# Patient Record
Sex: Female | Born: 1975 | Race: Black or African American | Hispanic: No | Marital: Married | Smoking: Never smoker
Health system: Southern US, Community
[De-identification: ages and names within clinical notes are randomized; demographics above are authoritative.]

## PROBLEM LIST (undated history)

## (undated) ENCOUNTER — Inpatient Hospital Stay (HOSPITAL_COMMUNITY): Payer: Self-pay

## (undated) DIAGNOSIS — J302 Other seasonal allergic rhinitis: Secondary | ICD-10-CM

## (undated) DIAGNOSIS — B999 Unspecified infectious disease: Secondary | ICD-10-CM

## (undated) DIAGNOSIS — R87619 Unspecified abnormal cytological findings in specimens from cervix uteri: Secondary | ICD-10-CM

## (undated) DIAGNOSIS — IMO0002 Reserved for concepts with insufficient information to code with codable children: Secondary | ICD-10-CM

## (undated) DIAGNOSIS — O139 Gestational [pregnancy-induced] hypertension without significant proteinuria, unspecified trimester: Secondary | ICD-10-CM

## (undated) DIAGNOSIS — F419 Anxiety disorder, unspecified: Secondary | ICD-10-CM

## (undated) DIAGNOSIS — J189 Pneumonia, unspecified organism: Secondary | ICD-10-CM

## (undated) HISTORY — DX: Reserved for concepts with insufficient information to code with codable children: IMO0002

## (undated) HISTORY — DX: Unspecified abnormal cytological findings in specimens from cervix uteri: R87.619

## (undated) HISTORY — DX: Gestational (pregnancy-induced) hypertension without significant proteinuria, unspecified trimester: O13.9

## (undated) HISTORY — DX: Other seasonal allergic rhinitis: J30.2

## (undated) HISTORY — DX: Unspecified infectious disease: B99.9

## (undated) HISTORY — DX: Pneumonia, unspecified organism: J18.9

## (undated) HISTORY — DX: Anxiety disorder, unspecified: F41.9

---

## 2006-11-30 DIAGNOSIS — J189 Pneumonia, unspecified organism: Secondary | ICD-10-CM

## 2006-11-30 HISTORY — DX: Pneumonia, unspecified organism: J18.9

## 2009-04-04 ENCOUNTER — Emergency Department (HOSPITAL_COMMUNITY): Admission: EM | Admit: 2009-04-04 | Discharge: 2009-04-04 | Payer: Self-pay | Admitting: Emergency Medicine

## 2009-11-20 ENCOUNTER — Ambulatory Visit (HOSPITAL_COMMUNITY): Admission: RE | Admit: 2009-11-20 | Discharge: 2009-11-20 | Payer: Self-pay | Admitting: Obstetrics and Gynecology

## 2010-09-12 ENCOUNTER — Inpatient Hospital Stay (HOSPITAL_COMMUNITY): Admission: AD | Admit: 2010-09-12 | Discharge: 2010-09-15 | Payer: Self-pay | Admitting: Obstetrics and Gynecology

## 2010-09-22 ENCOUNTER — Inpatient Hospital Stay (HOSPITAL_COMMUNITY): Admission: AD | Admit: 2010-09-22 | Discharge: 2010-09-22 | Payer: Self-pay | Admitting: Obstetrics and Gynecology

## 2011-02-11 LAB — URINALYSIS, ROUTINE W REFLEX MICROSCOPIC
Glucose, UA: NEGATIVE mg/dL
Protein, ur: NEGATIVE mg/dL
pH: 6 (ref 5.0–8.0)

## 2011-02-11 LAB — CBC
HCT: 29.4 % — ABNORMAL LOW (ref 36.0–46.0)
MCH: 28.3 pg (ref 26.0–34.0)
MCHC: 33.8 g/dL (ref 30.0–36.0)
MCHC: 33.9 g/dL (ref 30.0–36.0)
MCV: 83.1 fL (ref 78.0–100.0)
Platelets: 497 10*3/uL — ABNORMAL HIGH (ref 150–400)
RBC: 3.81 MIL/uL — ABNORMAL LOW (ref 3.87–5.11)
RDW: 13.3 % (ref 11.5–15.5)

## 2011-02-11 LAB — COMPREHENSIVE METABOLIC PANEL
ALT: 21 U/L (ref 0–35)
AST: 16 U/L (ref 0–37)
Calcium: 8.8 mg/dL (ref 8.4–10.5)
Sodium: 138 mEq/L (ref 135–145)
Total Protein: 6.8 g/dL (ref 6.0–8.3)

## 2011-02-11 LAB — CCBB MATERNAL DONOR DRAW

## 2011-02-12 LAB — CBC
HCT: 33.6 % — ABNORMAL LOW (ref 36.0–46.0)
Hemoglobin: 11.4 g/dL — ABNORMAL LOW (ref 12.0–15.0)
RDW: 13 % (ref 11.5–15.5)
WBC: 7.5 10*3/uL (ref 4.0–10.5)

## 2011-02-12 LAB — RPR: RPR Ser Ql: NONREACTIVE

## 2011-03-10 LAB — POCT PREGNANCY, URINE

## 2012-01-28 ENCOUNTER — Encounter: Payer: Self-pay | Admitting: Obstetrics and Gynecology

## 2012-02-08 ENCOUNTER — Encounter (INDEPENDENT_AMBULATORY_CARE_PROVIDER_SITE_OTHER): Payer: BC Managed Care – PPO | Admitting: Obstetrics and Gynecology

## 2012-02-08 DIAGNOSIS — Z30432 Encounter for removal of intrauterine contraceptive device: Secondary | ICD-10-CM

## 2012-02-08 DIAGNOSIS — Z3040 Encounter for surveillance of contraceptives, unspecified: Secondary | ICD-10-CM

## 2012-02-15 ENCOUNTER — Encounter (INDEPENDENT_AMBULATORY_CARE_PROVIDER_SITE_OTHER): Payer: BC Managed Care – PPO

## 2012-02-15 ENCOUNTER — Encounter (INDEPENDENT_AMBULATORY_CARE_PROVIDER_SITE_OTHER): Payer: BC Managed Care – PPO | Admitting: Obstetrics and Gynecology

## 2012-02-15 DIAGNOSIS — N949 Unspecified condition associated with female genital organs and menstrual cycle: Secondary | ICD-10-CM

## 2012-07-18 ENCOUNTER — Ambulatory Visit (INDEPENDENT_AMBULATORY_CARE_PROVIDER_SITE_OTHER): Payer: BC Managed Care – PPO | Admitting: Family Medicine

## 2012-07-18 VITALS — BP 95/56 | HR 50 | Temp 98.1°F | Resp 18 | Ht 62.5 in | Wt 178.0 lb

## 2012-07-18 DIAGNOSIS — R141 Gas pain: Secondary | ICD-10-CM

## 2012-07-18 DIAGNOSIS — R143 Flatulence: Secondary | ICD-10-CM

## 2012-07-18 DIAGNOSIS — K5900 Constipation, unspecified: Secondary | ICD-10-CM

## 2012-07-18 DIAGNOSIS — R142 Eructation: Secondary | ICD-10-CM

## 2012-07-18 DIAGNOSIS — K219 Gastro-esophageal reflux disease without esophagitis: Secondary | ICD-10-CM

## 2012-07-18 DIAGNOSIS — R140 Abdominal distension (gaseous): Secondary | ICD-10-CM

## 2012-07-18 LAB — POCT URINALYSIS DIPSTICK
Blood, UA: NEGATIVE
Ketones, UA: NEGATIVE
Protein, UA: NEGATIVE
Spec Grav, UA: 1.025
Urobilinogen, UA: 0.2
pH, UA: 6

## 2012-07-18 LAB — POCT CBC
HCT, POC: 42.5 % (ref 37.7–47.9)
MCH, POC: 28.4 pg (ref 27–31.2)
MCV: 93.1 fL (ref 80–97)
MID (cbc): 0.6 (ref 0–0.9)
Platelet Count, POC: 357 10*3/uL (ref 142–424)
RBC: 4.57 M/uL (ref 4.04–5.48)
WBC: 6.2 10*3/uL (ref 4.6–10.2)

## 2012-07-18 LAB — POCT UA - MICROSCOPIC ONLY
Bacteria, U Microscopic: NEGATIVE
Casts, Ur, LPF, POC: NEGATIVE
Crystals, Ur, HPF, POC: NEGATIVE
Epithelial cells, urine per micros: NEGATIVE
Mucus, UA: NEGATIVE

## 2012-07-18 MED ORDER — OMEPRAZOLE 40 MG PO CPDR: 40.0000 mg | DELAYED_RELEASE_CAPSULE | Freq: Every day | ORAL | Status: DC

## 2012-07-18 NOTE — Progress Notes (Signed)
Subjective: 36 year old lady who is here with complaint of having abdominal bloating and discomfort over the weekend especially. Her last period was around 4 weeks ago. She denies risk of pregnancy. She really did not have a lot of pain. It was more the bloating and discomfort. She has been burping a lot. She felt like her esophagus with bothering her some. She had pains up into her chest and head. Bowels have been sluggish.  Objective: Pleasant alert oriented lady in no acute distress. Throat has a little bit of a cystic appearance to the right tonsil. Neck supple without significant nodes. Chest clear. Heart regular without murmurs. Abdomen was soft without organomegaly masses or other abnormality. She is mildly tender across the epigastrium, more in the left upper quadrant.  Assessment: Abdominal bloating. Consider the possibility of gallbladder problems, GERD, and constipation.  Plan: Check blood work and a urinalysis and urine hcg  Results for orders placed in visit on 07/18/12  POCT CBC      Component Value Range   WBC 6.2  4.6 - 10.2 K/uL   Lymph, poc 2.7  0.6 - 3.4   POC LYMPH PERCENT 44.3  10 - 50 %L   MID (cbc) 0.6  0 - 0.9   POC MID % 9.7  0 - 12 %M   POC Granulocyte 2.9  2 - 6.9   Granulocyte percent 46.0  37 - 80 %G   RBC 4.57  4.04 - 5.48 M/uL   Hemoglobin 13.0  12.2 - 16.2 g/dL   HCT, POC 16.1  09.6 - 47.9 %   MCV 93.1  80 - 97 fL   MCH, POC 28.4  27 - 31.2 pg   MCHC 30.6 (*) 31.8 - 35.4 g/dL   RDW, POC 04.5     Platelet Count, POC 357  142 - 424 K/uL   MPV 8.8  0 - 99.8 fL  POCT UA - MICROSCOPIC ONLY      Component Value Range   WBC, Ur, HPF, POC neg     RBC, urine, microscopic neg     Bacteria, U Microscopic neg     Mucus, UA neg     Epithelial cells, urine per micros neg     Crystals, Ur, HPF, POC neg     Casts, Ur, LPF, POC neg     Yeast, UA neg    POCT URINALYSIS DIPSTICK      Component Value Range   Color, UA yellow     Clarity, UA clear     Glucose,  UA neg     Bilirubin, UA neg     Ketones, UA neg     Spec Grav, UA 1.025     Blood, UA neg     pH, UA 6.0     Protein, UA neg     Urobilinogen, UA 0.2     Nitrite, UA neg     Leukocytes, UA Negative    POCT URINE PREGNANCY      Component Value Range   Preg Test, Ur Negative     Treat for constipation and secondary reflux. If she is not doing better, and he is continuing to be bloated a lot, we will need to get a gallbladder ultrasound on her.  Assessment: Constipation GERD Bloating

## 2012-07-18 NOTE — Patient Instructions (Signed)
MiraLax  Lots of water  Regular exercise  Lots of fresh fruits and vegetables  Avoid excessive bananas

## 2012-07-19 LAB — COMPREHENSIVE METABOLIC PANEL
BUN: 17 mg/dL (ref 6–23)
CO2: 27 mEq/L (ref 19–32)
Calcium: 9.3 mg/dL (ref 8.4–10.5)
Chloride: 103 mEq/L (ref 96–112)
Creat: 0.76 mg/dL (ref 0.50–1.10)
Glucose, Bld: 91 mg/dL (ref 70–99)
Total Bilirubin: 0.3 mg/dL (ref 0.3–1.2)

## 2012-09-11 ENCOUNTER — Emergency Department (HOSPITAL_COMMUNITY): Payer: BC Managed Care – PPO

## 2012-09-11 ENCOUNTER — Encounter (HOSPITAL_COMMUNITY): Payer: Self-pay | Admitting: *Deleted

## 2012-09-11 ENCOUNTER — Emergency Department (HOSPITAL_COMMUNITY)
Admission: EM | Admit: 2012-09-11 | Discharge: 2012-09-11 | Disposition: A | Discharge status: Home / Self Care | Payer: BC Managed Care – PPO | Attending: Emergency Medicine | Admitting: Emergency Medicine

## 2012-09-11 DIAGNOSIS — R1032 Left lower quadrant pain: Secondary | ICD-10-CM | POA: Insufficient documentation

## 2012-09-11 DIAGNOSIS — K5900 Constipation, unspecified: Secondary | ICD-10-CM

## 2012-09-11 DIAGNOSIS — R109 Unspecified abdominal pain: Secondary | ICD-10-CM

## 2012-09-11 LAB — URINALYSIS, ROUTINE W REFLEX MICROSCOPIC
Glucose, UA: NEGATIVE mg/dL
Ketones, ur: NEGATIVE mg/dL
Leukocytes, UA: NEGATIVE
Nitrite: NEGATIVE
Protein, ur: NEGATIVE mg/dL
pH: 7 (ref 5.0–8.0)

## 2012-09-11 LAB — COMPREHENSIVE METABOLIC PANEL
ALT: 12 U/L (ref 0–35)
AST: 14 U/L (ref 0–37)
Albumin: 3.6 g/dL (ref 3.5–5.2)
Alkaline Phosphatase: 55 U/L (ref 39–117)
BUN: 11 mg/dL (ref 6–23)
Chloride: 103 mEq/L (ref 96–112)
Potassium: 3.6 mEq/L (ref 3.5–5.1)
Sodium: 137 mEq/L (ref 135–145)
Total Bilirubin: 0.2 mg/dL — ABNORMAL LOW (ref 0.3–1.2)
Total Protein: 7 g/dL (ref 6.0–8.3)

## 2012-09-11 LAB — CBC WITH DIFFERENTIAL/PLATELET
Basophils Relative: 0 % (ref 0–1)
Eosinophils Absolute: 0.2 10*3/uL (ref 0.0–0.7)
Hemoglobin: 12.8 g/dL (ref 12.0–15.0)
MCH: 30 pg (ref 26.0–34.0)
MCHC: 34.5 g/dL (ref 30.0–36.0)
Monocytes Relative: 7 % (ref 3–12)
Neutro Abs: 2.4 10*3/uL (ref 1.7–7.7)
Neutrophils Relative %: 68 % (ref 43–77)
Platelets: 283 10*3/uL (ref 150–400)
RBC: 4.27 MIL/uL (ref 3.87–5.11)

## 2012-09-11 LAB — PREGNANCY, URINE: Preg Test, Ur: NEGATIVE

## 2012-09-11 MED ORDER — POLYETHYLENE GLYCOL 3350 17 G PO PACK: 17.0000 g | PACK | Freq: Every day | ORAL | Status: DC

## 2012-09-11 MED ORDER — METRONIDAZOLE 500 MG PO TABS: 500.0000 mg | ORAL_TABLET | Freq: Two times a day (BID) | ORAL | Status: DC

## 2012-09-11 MED ORDER — CIPROFLOXACIN HCL 500 MG PO TABS: 500.0000 mg | ORAL_TABLET | Freq: Two times a day (BID) | ORAL | Status: DC

## 2012-09-11 NOTE — ED Provider Notes (Signed)
History     CSN: [id number]  Arrival date & time 09/11/12  0159   First MD Initiated Contact with Patient 09/11/12 0225      Chief Complaint  Patient presents with  . Abdominal Pain    (Consider location/radiation/quality/duration/timing/severity/associated sxs/prior treatment) Patient is a 36 y.o. female presenting with abdominal pain. The history is provided by the patient.  Abdominal Pain The primary symptoms of the illness include abdominal pain. The primary symptoms of the illness do not include fever, shortness of breath, nausea, vomiting, dysuria or vaginal discharge.  The patient states that she believes she is currently not pregnant. The patient has had a change in bowel habit. Symptoms associated with the illness do not include back pain. Associated symptoms comments: LLQ abdominal pain that started last week and resolved. It returned yesterday and has been constant since. No N, V or diarrhea. In fact, she reports moving her bowels less than her usual. No abdominal surgeries in the past. No fever. She denies vaginal discharge, dysuria or hematuria.Marland Kitchen    History reviewed. No pertinent past medical history.  History reviewed. No pertinent past surgical history.  History reviewed. No pertinent family history.  History  Substance Use Topics  . Smoking status: Never Smoker   . Smokeless tobacco: Never Used  . Alcohol Use: Yes     social    OB History    Grav Para Term Preterm Abortions TAB SAB Ect Mult Living                  Review of Systems  Constitutional: Negative for fever.  Respiratory: Negative for shortness of breath.   Cardiovascular: Negative for chest pain.  Gastrointestinal: Positive for abdominal pain. Negative for nausea and vomiting.  Genitourinary: Negative for dysuria, flank pain and vaginal discharge.  Musculoskeletal: Negative for back pain.    Allergies  Review of patient's allergies indicates no known allergies.  Home Medications  No  current outpatient prescriptions on file.  BP 117/68  Pulse 72  Temp 98.7 F (37.1 C) (Oral)  Resp 18  Wt 173 lb (78.472 kg)  SpO2 100%  LMP 08/13/2012  Physical Exam  Constitutional: She is oriented to person, place, and time. She appears well-developed and well-nourished. No distress.  HENT:  Mouth/Throat: Oropharynx is clear and moist.  Cardiovascular: Normal rate and regular rhythm.   No murmur heard. Pulmonary/Chest: Effort normal and breath sounds normal. She has no wheezes. She has no rales.  Abdominal: Soft. Bowel sounds are normal. She exhibits no mass. There is tenderness. There is no rebound and no guarding.       LLQ Tenderness that is mild.  Neurological: She is alert and oriented to person, place, and time.  Skin: Skin is warm and dry.  Psychiatric: She has a normal mood and affect.    ED Course  Procedures (including critical care time)  Labs Reviewed  CBC WITH DIFFERENTIAL - Abnormal; Notable for the following:    WBC 3.6 (*)     All other components within normal limits  COMPREHENSIVE METABOLIC PANEL - Abnormal; Notable for the following:    Total Bilirubin 0.2 (*)     All other components within normal limits  URINALYSIS, ROUTINE W REFLEX MICROSCOPIC  PREGNANCY, URINE  LIPASE, BLOOD   Results for orders placed during the hospital encounter of 09/11/12  URINALYSIS, ROUTINE W REFLEX MICROSCOPIC      Component Value Range   Color, Urine YELLOW  YELLOW   APPearance CLEAR  CLEAR   Specific Gravity, Urine 1.021  1.005 - 1.030   pH 7.0  5.0 - 8.0   Glucose, UA NEGATIVE  NEGATIVE mg/dL   Hgb urine dipstick NEGATIVE  NEGATIVE   Bilirubin Urine NEGATIVE  NEGATIVE   Ketones, ur NEGATIVE  NEGATIVE mg/dL   Protein, ur NEGATIVE  NEGATIVE mg/dL   Urobilinogen, UA 1.0  0.0 - 1.0 mg/dL   Nitrite NEGATIVE  NEGATIVE   Leukocytes, UA NEGATIVE  NEGATIVE  PREGNANCY, URINE      Component Value Range   Preg Test, Ur NEGATIVE  NEGATIVE  CBC WITH DIFFERENTIAL       Component Value Range   WBC 3.6 (*) 4.0 - 10.5 K/uL   RBC 4.27  3.87 - 5.11 MIL/uL   Hemoglobin 12.8  12.0 - 15.0 g/dL   HCT 40.9  81.1 - 91.4 %   MCV 86.9  78.0 - 100.0 fL   MCH 30.0  26.0 - 34.0 pg   MCHC 34.5  30.0 - 36.0 g/dL   RDW 78.2  95.6 - 21.3 %   Platelets 283  150 - 400 K/uL   Neutrophils Relative 68  43 - 77 %   Neutro Abs 2.4  1.7 - 7.7 K/uL   Lymphocytes Relative 21  12 - 46 %   Lymphs Abs 0.8  0.7 - 4.0 K/uL   Monocytes Relative 7  3 - 12 %   Monocytes Absolute 0.3  0.1 - 1.0 K/uL   Eosinophils Relative 4  0 - 5 %   Eosinophils Absolute 0.2  0.0 - 0.7 K/uL   Basophils Relative 0  0 - 1 %   Basophils Absolute 0.0  0.0 - 0.1 K/uL  COMPREHENSIVE METABOLIC PANEL      Component Value Range   Sodium 137  135 - 145 mEq/L   Potassium 3.6  3.5 - 5.1 mEq/L   Chloride 103  96 - 112 mEq/L   CO2 27  19 - 32 mEq/L   Glucose, Bld 97  70 - 99 mg/dL   BUN 11  6 - 23 mg/dL   Creatinine, Ser 0.86  0.50 - 1.10 mg/dL   Calcium 8.5  8.4 - 57.8 mg/dL   Total Protein 7.0  6.0 - 8.3 g/dL   Albumin 3.6  3.5 - 5.2 g/dL   AST 14  0 - 37 U/L   ALT 12  0 - 35 U/L   Alkaline Phosphatase 55  39 - 117 U/L   Total Bilirubin 0.2 (*) 0.3 - 1.2 mg/dL   GFR calc non Af Amer >90  >90 mL/min   GFR calc Af Amer >90  >90 mL/min  LIPASE, BLOOD      Component Value Range   Lipase 33  11 - 59 U/L    Dg Abd 2 Views  09/11/2012  *RADIOLOGY REPORT*  Clinical Data: Left lower quadrant abdominal pain  ABDOMEN - 2 VIEW  Comparison: None.  Findings: Nonobstructive bowel gas pattern.  No evidence of free air under the diaphragm on the upright view.  Visualized osseous structures are within normal limits.  IMPRESSION: Unremarkable abdominal radiographs.   Original Report Authenticated By: Charline Bills, M.D.      No diagnosis found.  1. Abdominal pain 2. Constipation   MDM  LLQ abdominal pain in an otherwise healthy female with changes in bowel movements consistent with constipation.  DDx has to  include diverticulitis although given age and symptoms is less likely. Risk vs. Benefit does  not support CT scan of abdomen. Will recommend bowel cleansing and treat with cipro and flagyl with 24 hours recheck if worse or no better.        Rodena Medin, PA-C 09/11/12 [phone]

## 2012-09-11 NOTE — ED Notes (Signed)
"  sick since she ate a salad yesterday"

## 2012-09-12 NOTE — ED Provider Notes (Signed)
Medical screening examination/treatment/procedure(s) were performed by non-physician practitioner and as supervising physician I was immediately available for consultation/collaboration.  Caston Coopersmith, MD 09/12/12 0008 

## 2012-12-13 ENCOUNTER — Other Ambulatory Visit: Payer: Self-pay | Admitting: Internal Medicine

## 2012-12-28 ENCOUNTER — Ambulatory Visit: Payer: BC Managed Care – PPO | Admitting: Obstetrics and Gynecology

## 2012-12-28 ENCOUNTER — Encounter: Payer: Self-pay | Admitting: Obstetrics and Gynecology

## 2012-12-28 VITALS — BP 110/68

## 2012-12-28 DIAGNOSIS — N979 Female infertility, unspecified: Secondary | ICD-10-CM

## 2012-12-28 DIAGNOSIS — R87610 Atypical squamous cells of undetermined significance on cytologic smear of cervix (ASC-US): Secondary | ICD-10-CM

## 2012-12-28 DIAGNOSIS — N926 Irregular menstruation, unspecified: Secondary | ICD-10-CM

## 2012-12-28 DIAGNOSIS — N970 Female infertility associated with anovulation: Secondary | ICD-10-CM

## 2012-12-28 MED ORDER — PRENATAL VITAMINS PLUS 27-1 MG PO TABS: 1.0000 | ORAL_TABLET | Freq: Every day | ORAL | Status: DC

## 2012-12-28 MED ORDER — CLOMIPHENE CITRATE 50 MG PO TABS: ORAL_TABLET | ORAL | Status: DC

## 2012-12-28 MED ORDER — CLOMIPHENE CITRATE 50 MG PO TABS: 50.0000 mg | ORAL_TABLET | Freq: Every day | ORAL | Status: AC

## 2012-12-28 NOTE — Progress Notes (Signed)
Subjective:    Carla Sherman is a 37 y.o. female No obstetric history on file. who presents for evaluation of infertility. Patient and partner have been attempting conception since December 2012. Partner: husband is 13 years old does have children. Couple has been attempting conception since December 2012. Pregnancies with current partner: yes.  Menstrual history:   LMP:  Patient's last menstrual period was 12/26/2012. Menarche:  37 years old Menstrual cycle:  irreg Pelvic pain: none  Endocrine history:   Basal body temperature Biphasic n/a  TSH done by PCP said to be nl January 2014  Prolactin nl last pregnancy January 2010  [facility] n/a January 2010  Day 21 progesterone .03 December 2008  Hysterosalpyngogram normal January 2010  Glucose:Insulin ratio n/a January 2010  Semen analysis normalJanuary 2010  Medications clomid for 1 cycles; last cycle with conception 2010 ago.  Other therapies Not applicable  Insemination not applicable   Obstetrical history:   History: Gravida 1 Para 1  Contraception history:   IUD: Past: yes   Habits:   Patient reports:  History   Social History  . Marital Status: Married    Spouse Name: [name]    Number of Children: [name]  . Years of Education: [name]   Social History Main Topics  . Smoking status: Never Smoker   . Smokeless tobacco: Never Used  . Alcohol Use: Yes     Comment: social  . Drug Use: No  . Sexually Active: None   Other Topics Concern  . None   Social History Narrative  . None     The following portions of the patient's history were reviewed and updated as appropriate: allergies, current medications, past family history, past medical history, past social history, past surgical history and problem list.  Review of Systems Pertinent items are noted in HPI.   Objective:     LMP 12/26/2012   Wt Readings from Last 1 Encounters:  09/11/12 173 lb (78.472 kg)    BMI: There is no height or weight on file to  calculate BMI. General Appearance: Alert, appropriate appearance for age. No acute distress HEENT: Grossly normal Neck / Thyroid: Supple, no masses, nodes or enlargement Lungs: clear to auscultation bilaterally Back: No CVA tenderness Gastrointestinal: Soft, non-tender, no masses or organomegaly Pelvic Exam: Vulva and vagina appear normal. Bimanual exam reveals normal uterus ULNS and slightly irreg and no adnexal masses. 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   Plan:   Tests ordered:   ROI for labs from Dr. Renae Gloss  Clomid Day 21 Progesterone PNV Follow-up:  [id number]  Dierdre Forth MD

## 2012-12-30 LAB — PAP IG AND HPV HIGH-RISK

## 2013-01-03 ENCOUNTER — Ambulatory Visit: Payer: BC Managed Care – PPO | Admitting: Obstetrics and Gynecology

## 2013-01-16 ENCOUNTER — Other Ambulatory Visit: Payer: BC Managed Care – PPO

## 2013-01-16 DIAGNOSIS — N979 Female infertility, unspecified: Secondary | ICD-10-CM

## 2013-01-17 LAB — PROGESTERONE: Progesterone: 18.7 ng/mL

## 2013-01-20 ENCOUNTER — Telehealth: Payer: Self-pay | Admitting: Obstetrics and Gynecology

## 2013-01-20 NOTE — Telephone Encounter (Signed)
vph pt 

## 2013-01-20 NOTE — Telephone Encounter (Signed)
Tc to pt per telephone call. Pt aware progesterone-ovulatory. Appt fu for infertility sched 01/25/13 @ 4:15p with [facility]. Pt agrees.

## 2013-01-25 ENCOUNTER — Other Ambulatory Visit: Payer: Self-pay

## 2013-01-25 ENCOUNTER — Encounter: Payer: Self-pay | Admitting: Obstetrics and Gynecology

## 2013-01-25 ENCOUNTER — Other Ambulatory Visit: Payer: BC Managed Care – PPO

## 2013-01-25 ENCOUNTER — Ambulatory Visit: Payer: BC Managed Care – PPO | Admitting: Obstetrics and Gynecology

## 2013-01-25 DIAGNOSIS — N979 Female infertility, unspecified: Secondary | ICD-10-CM

## 2013-01-25 DIAGNOSIS — N970 Female infertility associated with anovulation: Secondary | ICD-10-CM

## 2013-01-25 DIAGNOSIS — N912 Amenorrhea, unspecified: Secondary | ICD-10-CM

## 2013-01-25 NOTE — Progress Notes (Signed)
Subjective:    Carla Sherman is a 37 y.o. female No obstetric history on file. who presents for followup with a diagnosis of  infertility Current medication:  Clomid  Objective:   Infertility laboratory results have been updated and/or reviewed:positive pregnancy test    LMP 12/26/2012  Wt Readings from Last 1 Encounters:  09/11/12 173 lb (78.472 kg)    BMI: There is no weight on file to calculate BMI.   Assessment:   Early pregnancy  Plan:   HCG today and in 48 hrs U/s at hcg around 6000   Dierdre Forth MD

## 2013-01-26 ENCOUNTER — Telehealth: Payer: Self-pay

## 2013-01-26 DIAGNOSIS — N979 Female infertility, unspecified: Secondary | ICD-10-CM

## 2013-01-26 NOTE — Telephone Encounter (Signed)
Tc to pt per [facility] results=352. Mailbox full and unable to leave vm. Pt needs repeat QHCG on 01/27/13 per [facility].

## 2013-01-27 NOTE — Telephone Encounter (Signed)
Tc to pt per test results. Repeat QHCG sched today @ [address] Psychologist, educational). Pt voices understanding.

## 2013-01-30 ENCOUNTER — Telehealth: Payer: Self-pay | Admitting: Obstetrics and Gynecology

## 2013-01-31 ENCOUNTER — Other Ambulatory Visit: Payer: BC Managed Care – PPO

## 2013-01-31 DIAGNOSIS — N979 Female infertility, unspecified: Secondary | ICD-10-CM

## 2013-01-31 NOTE — Telephone Encounter (Signed)
Tc to pt. Unable to leave vm due to full mailbox.

## 2013-01-31 NOTE — Telephone Encounter (Signed)
Tc from pt. QHCG sched today @ 11:30a. Pt agrees.

## 2013-02-01 ENCOUNTER — Telehealth: Payer: Self-pay | Admitting: Obstetrics and Gynecology

## 2013-02-13 ENCOUNTER — Ambulatory Visit: Payer: BC Managed Care – PPO | Admitting: Obstetrics and Gynecology

## 2013-02-13 ENCOUNTER — Other Ambulatory Visit: Payer: Self-pay

## 2013-02-13 ENCOUNTER — Other Ambulatory Visit: Payer: BC Managed Care – PPO

## 2013-02-13 DIAGNOSIS — Z331 Pregnant state, incidental: Secondary | ICD-10-CM

## 2013-02-13 DIAGNOSIS — N979 Female infertility, unspecified: Secondary | ICD-10-CM

## 2013-02-13 DIAGNOSIS — O26849 Uterine size-date discrepancy, unspecified trimester: Secondary | ICD-10-CM

## 2013-02-13 LAB — PRENATAL PANEL VII
Eosinophils Absolute: 0.1 10*3/uL (ref 0.0–0.7)
Eosinophils Relative: 2 % (ref 0–5)
HCT: 36 % (ref 36.0–46.0)
Hemoglobin: 12 g/dL (ref 12.0–15.0)
Lymphocytes Relative: 28 % (ref 12–46)
Lymphs Abs: 1.7 10*3/uL (ref 0.7–4.0)
MCH: 29.6 pg (ref 26.0–34.0)
MCV: 88.7 fL (ref 78.0–100.0)
Monocytes Absolute: 0.6 10*3/uL (ref 0.1–1.0)
Monocytes Relative: 9 % (ref 3–12)
RBC: 4.06 MIL/uL (ref 3.87–5.11)
WBC: 6.1 10*3/uL (ref 4.0–10.5)

## 2013-02-13 LAB — POCT URINALYSIS DIPSTICK
Bilirubin, UA: NEGATIVE
Ketones, UA: NEGATIVE
Leukocytes, UA: NEGATIVE
Nitrite, UA: NEGATIVE

## 2013-02-13 NOTE — Progress Notes (Signed)
NOB interview. U/S results reviewed with Dr [facility] via phone by CW, CMA. Pt to have repeat U/S for viability and NOb W/U in 2 weeks. Pt states having vulvar itching and thick white vag D/C. Also irritation on thighs. Sx have been coming and going x 3 weeks. Monistat causes burning. Per [facility] suggested use plain yogurt as cream on affected area 2 times a day. Call if no improvement. Pt had trace glucose in urine. States had Honey nut cherrios for breakfast.

## 2013-02-15 LAB — CULTURE, OB URINE

## 2013-02-27 ENCOUNTER — Other Ambulatory Visit: Payer: Self-pay | Admitting: Obstetrics and Gynecology

## 2013-02-27 DIAGNOSIS — N979 Female infertility, unspecified: Secondary | ICD-10-CM

## 2013-02-27 LAB — US OB TRANSVAGINAL

## 2013-02-27 LAB — US OB COMP LESS 14 WKS

## 2013-06-23 ENCOUNTER — Inpatient Hospital Stay (HOSPITAL_COMMUNITY)
Admission: AD | Admit: 2013-06-23 | Discharge: 2013-06-23 | Disposition: A | Discharge status: Home / Self Care | Payer: Self-pay | Source: Ambulatory Visit | Attending: Obstetrics and Gynecology | Admitting: Obstetrics and Gynecology

## 2013-06-23 ENCOUNTER — Encounter (HOSPITAL_COMMUNITY): Payer: Self-pay | Admitting: *Deleted

## 2013-06-23 DIAGNOSIS — O0930 Supervision of pregnancy with insufficient antenatal care, unspecified trimester: Secondary | ICD-10-CM | POA: Insufficient documentation

## 2013-06-23 DIAGNOSIS — J3489 Other specified disorders of nose and nasal sinuses: Secondary | ICD-10-CM | POA: Insufficient documentation

## 2013-06-23 DIAGNOSIS — O0932 Supervision of pregnancy with insufficient antenatal care, second trimester: Secondary | ICD-10-CM

## 2013-06-23 DIAGNOSIS — R0602 Shortness of breath: Secondary | ICD-10-CM | POA: Insufficient documentation

## 2013-06-23 DIAGNOSIS — R05 Cough: Secondary | ICD-10-CM | POA: Insufficient documentation

## 2013-06-23 DIAGNOSIS — O99891 Other specified diseases and conditions complicating pregnancy: Secondary | ICD-10-CM | POA: Insufficient documentation

## 2013-06-23 DIAGNOSIS — R059 Cough, unspecified: Secondary | ICD-10-CM | POA: Insufficient documentation

## 2013-06-23 DIAGNOSIS — J309 Allergic rhinitis, unspecified: Secondary | ICD-10-CM

## 2013-06-23 DIAGNOSIS — E860 Dehydration: Secondary | ICD-10-CM | POA: Insufficient documentation

## 2013-06-23 LAB — URINALYSIS, ROUTINE W REFLEX MICROSCOPIC
Glucose, UA: 100 mg/dL — AB
Hgb urine dipstick: NEGATIVE
Specific Gravity, Urine: >1.03 — ABNORMAL HIGH (ref 1.005–1.030)

## 2013-06-23 LAB — URINE MICROSCOPIC-ADD ON

## 2013-06-23 NOTE — MAU Provider Note (Signed)
Asked to see patient due to [facility] midwife Haroldine Laws being occupied.  Chief Complaint:  Cough and Shortness of Breath   First Provider Initiated Contact with Patient 06/23/13 1248      HPI: Carla Sherman is a 37 y.o. G2P1001 at [redacted]w[redacted]d who presents to maternity admissions reporting 1 week history of upper respiratory congestion. She has Nonproductive cough, stuffy nose, sneezing, and  itchy tearing eyes. She used Mucinex with some improvement.  No fevers/chills, chest pain, SOB. Denies contractions, leakage of fluid or vaginal bleeding. Good fetal movement.   Pregnancy Course: Labs prenatal care since 03/27/2013 due to  financial issues. Last ultrasound at 12 weeks. Had yeast vulvovaginitis with rash which has slowly resolved.  Past Medical History: Past Medical History  Diagnosis Date  . Pneumonia 2008  . Pregnancy induced hypertension     PP 2011  . Abnormal Pap smear   . Infection     UTI X 1    Past obstetric history: OB History   Grav Para Term Preterm Abortions TAB SAB Ect Mult Living   2 1 1       1      # Outc Date GA Lbr Len/2nd Wgt Sex Del Anes PTL Lv   1 TRM 10/11 [redacted]w[redacted]d 14:00 6lb6oz(2.892kg) M SVD EPI  Yes   Comments: PP INCREASED BP   2 CUR              Obstetric Comments   2011 PP INCREASED BP; ON MED X 1 WEEK      Past Surgical History: Past Surgical History  Procedure Laterality Date  . No past surgeries      Family History: Family History  Problem Relation Age of Onset  . Bipolar disorder Mother   . Diabetes Mother   . Hyperlipidemia Mother   . Hypertension Mother   . Arthritis Maternal Aunt   . Cancer Maternal Aunt     BREAST  . Diabetes Maternal Aunt   . Arthritis Maternal Grandmother   . Heart disease Maternal Grandmother   . Cancer Maternal Grandfather     PROSTATE  . Cancer Paternal Grandmother     PANCREATIC  . Dementia Paternal Grandmother   . Cancer Paternal Grandfather     LUNG  . Cancer Maternal Aunt     BREAST  .  Early death Maternal Aunt     DIED IN 90'S  . Diabetes Maternal Aunt   . Cancer Cousin     LYMPHOMA  . Thyroid disease Cousin     Social History: History  Substance Use Topics  . Smoking status: Never Smoker   . Smokeless tobacco: Never Used  . Alcohol Use: Yes     Comment: social    Allergies: No Known Allergies  Meds:  Prescriptions prior to admission  Medication Sig Dispense Refill  . ciprofloxacin (CIPRO) 500 MG tablet Take 1 tablet (500 mg total) by mouth every 12 (twelve) hours.  10 tablet  0  . clomiPHENE (CLOMID) 50 MG tablet Pt to take 1 tablet po daily day 3-7 of menses  5 tablet  0  . metroNIDAZOLE (FLAGYL) 500 MG tablet Take 1 tablet (500 mg total) by mouth 2 (two) times daily.  14 tablet  0  . polyethylene glycol (MIRALAX) packet Take 17 g by mouth daily. Maximum 3 consecutive days  14 each  0  . Prenatal Vit-Fe Fumarate-FA (PRENATAL VITAMINS PLUS) 27-1 MG TABS Take 1 tablet by mouth daily.  30 tablet  12    ROS: Pertinent findings in history of present illness.  Physical Exam  Blood pressure 121/72, pulse 105, temperature 98 F (36.7 C), temperature source Oral, resp. rate 18, height 5\' 2"  (1.575 m), weight 194 lb 12.8 oz (88.361 kg), last menstrual period 12/26/2012. GENERAL: Well-developed, well-nourished female in no acute distress.  HEENT: Grossly normal HEART: normal rate RESP: normal effort. Lungs clear to auscultation bilaterally ABDOMEN: Soft, non-tender, gravid appropriate for gestational age EXTREMITIES: Nontender, no edema NEURO: alert and oriented  FHT:  Baseline 145-150 , moderate variability, accelerations present, no decelerations Contractions: none   Labs: Results for orders placed during the hospital encounter of 06/23/13 (from the past 24 hour(s))  URINALYSIS, ROUTINE W REFLEX MICROSCOPIC     Status: Abnormal   Collection Time    06/23/13 12:28 PM      Result Value Range   Color, Urine YELLOW  YELLOW   APPearance CLEAR  CLEAR    Specific Gravity, Urine >1.030 (*) 1.005 - 1.030   pH 6.0  5.0 - 8.0   Glucose, UA 100 (*) NEGATIVE mg/dL   Hgb urine dipstick NEGATIVE  NEGATIVE   Bilirubin Urine NEGATIVE  NEGATIVE   Ketones, ur 15 (*) NEGATIVE mg/dL   Protein, ur 30 (*) NEGATIVE mg/dL   Urobilinogen, UA 0.2  0.0 - 1.0 mg/dL   Nitrite NEGATIVE  NEGATIVE   Leukocytes, UA TRACE (*) NEGATIVE  URINE MICROSCOPIC-ADD ON     Status: Abnormal   Collection Time    06/23/13 12:28 PM      Result Value Range   Squamous Epithelial / LPF MANY (*) RARE   WBC, UA 0-2  <3 WBC/hpf   Bacteria, UA FEW (*) RARE   Urine-Other MUCOUS PRESENT        Assessment: 1. Allergic rhinitis   2. Insufficient prenatal care in second trimester   Mild dehydration  Plan: Discharge home Counseled on increasing fluids and rest. Recommended Zyrtec per product directions Follow-up Information   Schedule an appointment as soon as possible for a visit with [facility]. (Discussed with Haroldine Laws CNM who will notify office regarding laps of care and need for anatomic ultrasound)    Contact information:   [address] [place] [address] [phone]       Medication List    STOP taking these medications       ciprofloxacin 500 MG tablet  Commonly known as:  CIPRO     clomiPHENE 50 MG tablet  Commonly known as:  CLOMID     metroNIDAZOLE 500 MG tablet  Commonly known as:  FLAGYL     polyethylene glycol packet  Commonly known as:  MIRALAX      TAKE these medications       PRENATAL VITAMINS PLUS 27-1 MG Tabs  Take 1 tablet by mouth daily.        Danae Orleans, CNM 06/23/2013 1:15 PM

## 2013-06-23 NOTE — MAU Note (Signed)
Pt reports having nasal congestion, cough, andfeels short of breath. Having some diarrhea also.

## 2013-07-18 ENCOUNTER — Ambulatory Visit (INDEPENDENT_AMBULATORY_CARE_PROVIDER_SITE_OTHER): Payer: BC Managed Care – PPO | Admitting: Obstetrics & Gynecology

## 2013-07-18 VITALS — BP 106/64 | Temp 98.3°F | Wt 198.0 lb

## 2013-07-18 DIAGNOSIS — Z23 Encounter for immunization: Secondary | ICD-10-CM

## 2013-07-18 DIAGNOSIS — O09529 Supervision of elderly multigravida, unspecified trimester: Secondary | ICD-10-CM | POA: Insufficient documentation

## 2013-07-18 DIAGNOSIS — O09522 Supervision of elderly multigravida, second trimester: Secondary | ICD-10-CM

## 2013-07-18 DIAGNOSIS — Z3482 Encounter for supervision of other normal pregnancy, second trimester: Secondary | ICD-10-CM

## 2013-07-18 LAB — CBC
Platelets: 303 10*3/uL (ref 150–400)
RBC: 3.6 MIL/uL — ABNORMAL LOW (ref 3.87–5.11)
WBC: 6.8 10*3/uL (ref 4.0–10.5)

## 2013-07-18 LAB — POCT URINALYSIS DIP (DEVICE)
Glucose, UA: 100 mg/dL — AB
Nitrite: NEGATIVE
Urobilinogen, UA: 0.2 mg/dL (ref 0.0–1.0)

## 2013-07-18 NOTE — Progress Notes (Signed)
P = 89   Pt transferred care from [facility] due to change of insurance.  Last seen in April.

## 2013-07-18 NOTE — Progress Notes (Signed)
Transfer from [facility]. Good FM. No problems. Glucola (first), labs, TDAP today. We discussed Panorama. It was offered and declined.

## 2013-07-19 LAB — RPR

## 2013-07-19 LAB — GLUCOSE TOLERANCE, 1 HOUR (50G) W/O FASTING: Glucose, 1 Hour GTT: 114 mg/dL (ref 70–140)

## 2013-08-02 ENCOUNTER — Encounter: Payer: Self-pay | Admitting: *Deleted

## 2013-08-02 DIAGNOSIS — O09523 Supervision of elderly multigravida, third trimester: Secondary | ICD-10-CM

## 2013-08-08 ENCOUNTER — Ambulatory Visit (INDEPENDENT_AMBULATORY_CARE_PROVIDER_SITE_OTHER): Payer: BC Managed Care – PPO | Admitting: Obstetrics & Gynecology

## 2013-08-08 ENCOUNTER — Encounter: Payer: Self-pay | Admitting: Obstetrics & Gynecology

## 2013-08-08 VITALS — BP 105/71 | Wt 199.2 lb

## 2013-08-08 DIAGNOSIS — Z3480 Encounter for supervision of other normal pregnancy, unspecified trimester: Secondary | ICD-10-CM

## 2013-08-08 DIAGNOSIS — Z3483 Encounter for supervision of other normal pregnancy, third trimester: Secondary | ICD-10-CM

## 2013-08-08 LAB — POCT URINALYSIS DIP (DEVICE)
Glucose, UA: 250 mg/dL — AB
Nitrite: NEGATIVE
Urobilinogen, UA: 0.2 mg/dL (ref 0.0–1.0)

## 2013-08-08 NOTE — Progress Notes (Signed)
Pt reports feeling well today but, in general has felt tired. Send urine for cx

## 2013-08-08 NOTE — Patient Instructions (Addendum)
Pregnancy - Third Trimester The third trimester of pregnancy (the last 3 months) is a period of the most rapid growth for you and your baby. The baby approaches a length of 20 inches and a weight of 6 to 10 pounds. The baby is adding on fat and getting ready for life outside your body. While inside, babies have periods of sleeping and waking, sucking thumbs, and hiccuping. You can often feel small contractions of the uterus. This is false labor. It is also called Braxton-Hicks contractions. This is like a practice for labor. The usual problems in this stage of pregnancy include more difficulty breathing, swelling of the hands and feet from water retention, and having to urinate more often because of the uterus and baby pressing on your bladder.  PRENATAL EXAMS  Blood work may continue to be done during prenatal exams. These tests are done to check on your health and the probable health of your baby. Blood work is used to follow your blood levels (hemoglobin). Anemia (low hemoglobin) is common during pregnancy. Iron and vitamins are given to help prevent this. You may also continue to be checked for diabetes. Some of the past blood tests may be done again.  The size of the uterus is measured during each visit. This makes sure your baby is growing properly according to your pregnancy dates.  Your blood pressure is checked every prenatal visit. This is to make sure you are not getting toxemia.  Your urine is checked every prenatal visit for infection, diabetes, and protein.  Your weight is checked at each visit. This is done to make sure gains are happening at the suggested rate and that you and your baby are growing normally.  Sometimes, an ultrasound is performed to confirm the position and the proper growth and development of the baby. This is a test done that bounces harmless sound waves off the baby so your caregiver can more accurately determine a due date.  Discuss the type of pain medicine and  anesthesia you will have during your labor and delivery.  Discuss the possibility and anesthesia if a cesarean section might be necessary.  Inform your caregiver if there is any mental or physical violence at home. Sometimes, a specialized non-stress test, contraction stress test, and biophysical profile are done to make sure the baby is not having a problem. Checking the amniotic fluid surrounding the baby is called an amniocentesis. The amniotic fluid is removed by sticking a needle into the belly (abdomen). This is sometimes done near the end of pregnancy if an early delivery is required. In this case, it is done to help make sure the baby's lungs are mature enough for the baby to live outside of the womb. If the lungs are not mature and it is unsafe to deliver the baby, an injection of cortisone medicine is given to the mother 1 to 2 days before the delivery. This helps the baby's lungs mature and makes it safer to deliver the baby. CHANGES OCCURING IN THE THIRD TRIMESTER OF PREGNANCY Your body goes through many changes during pregnancy. They vary from person to person. Talk to your caregiver about changes you notice and are concerned about.  During the last trimester, you have probably had an increase in your appetite. It is normal to have cravings for certain foods. This varies from person to person and pregnancy to pregnancy.  You may begin to get stretch marks on your hips, abdomen, and breasts. These are normal changes in the body   during pregnancy. There are no exercises or medicines to take which prevent this change.  Constipation may be treated with a stool softener or adding bulk to your diet. Drinking lots of fluids, fiber in vegetables, fruits, and whole grains are helpful.  Exercising is also helpful. If you have been very active up until your pregnancy, most of these activities can be continued during your pregnancy. If you have been less active, it is helpful to start an exercise  program such as walking. Consult your caregiver before starting exercise programs.  Avoid all smoking, alcohol, non-prescribed drugs, herbs and "street drugs" during your pregnancy. These chemicals affect the formation and growth of the baby. Avoid chemicals throughout the pregnancy to ensure the delivery of a healthy infant.  Backache, varicose veins, and hemorrhoids may develop or get worse.  You will tire more easily in the third trimester, which is normal.  The baby's movements may be stronger and more often.  You may become short of breath easily.  Your belly button may stick out.  A yellow discharge may leak from your breasts called colostrum.  You may have a bloody mucus discharge. This usually occurs a few days to a week before labor begins. HOME CARE INSTRUCTIONS   Keep your caregiver's appointments. Follow your caregiver's instructions regarding medicine use, exercise, and diet.  During pregnancy, you are providing food for you and your baby. Continue to eat regular, well-balanced meals. Choose foods such as meat, fish, milk and other low fat dairy products, vegetables, fruits, and whole-grain breads and cereals. Your caregiver will tell you of the ideal weight gain.  A physical sexual relationship may be continued throughout pregnancy if there are no other problems such as early (premature) leaking of amniotic fluid from the membranes, vaginal bleeding, or belly (abdominal) pain.  Exercise regularly if there are no restrictions. Check with your caregiver if you are unsure of the safety of your exercises. Greater weight gain will occur in the last 2 trimesters of pregnancy. Exercising helps:  Control your weight.  Get you in shape for labor and delivery.  You lose weight after you deliver.  Rest a lot with legs elevated, or as needed for leg cramps or low back pain.  Wear a good support or jogging bra for breast tenderness during pregnancy. This may help if worn during  sleep. Pads or tissues may be used in the bra if you are leaking colostrum.  Do not use hot tubs, steam rooms, or saunas.  Wear your seat belt when driving. This protects you and your baby if you are in an accident.  Avoid raw meat, cat litter boxes and soil used by cats. These carry germs that can cause birth defects in the baby.  It is easier to leak urine during pregnancy. Tightening up and strengthening the pelvic muscles will help with this problem. You can practice stopping your urination while you are going to the bathroom. These are the same muscles you need to strengthen. It is also the muscles you would use if you were trying to stop from passing gas. You can practice tightening these muscles up 10 times a set and repeating this about 3 times per day. Once you know what muscles to tighten up, do not perform these exercises during urination. It is more likely to cause an infection by backing up the urine.  Ask for help if you have financial, counseling, or nutritional needs during pregnancy. Your caregiver will be able to offer counseling for these   needs as well as refer you for other special needs.  Make a list of emergency phone numbers and have them available.  Plan on getting help from family or friends when you go home from the hospital.  Make a trial run to the hospital.  Take prenatal classes with the father to understand, practice, and ask questions about the labor and delivery.  Prepare the baby's room or nursery.  Do not travel out of the city unless it is absolutely necessary and with the advice of your caregiver.  Wear only low or no heal shoes to have better balance and prevent falling. MEDICINES AND DRUG USE IN PREGNANCY  Take prenatal vitamins as directed. The vitamin should contain 1 milligram of folic acid. Keep all vitamins out of reach of children. Only a couple vitamins or tablets containing iron may be fatal to a baby or young child when ingested.  Avoid use  of all medicines, including herbs, over-the-counter medicines, not prescribed or suggested by your caregiver. Only take over-the-counter or prescription medicines for pain, discomfort, or fever as directed by your caregiver. Do not use aspirin, ibuprofen or naproxen unless approved by your caregiver.  Let your caregiver also know about herbs you may be using.  Alcohol is related to a number of birth defects. This includes fetal alcohol syndrome. All alcohol, in any form, should be avoided completely. Smoking will cause low birth rate and premature babies.  Illegal drugs are very harmful to the baby. They are absolutely forbidden. A baby born to an addicted mother will be addicted at birth. The baby will go through the same withdrawal an adult does. SEEK MEDICAL CARE IF: You have any concerns or worries during your pregnancy. It is better to call with your questions if you feel they cannot wait, rather than worry about them. SEEK IMMEDIATE MEDICAL CARE IF:   An unexplained oral temperature above 102 F (38.9 C) develops, or as your caregiver suggests.  You have leaking of fluid from the vagina. If leaking membranes are suspected, take your temperature and tell your caregiver of this when you call.  There is vaginal spotting, bleeding or passing clots. Tell your caregiver of the amount and how many pads are used.  You develop a bad smelling vaginal discharge with a change in the color from clear to white.  You develop vomiting that lasts more than 24 hours.  You develop chills or fever.  You develop shortness of breath.  You develop burning on urination.  You loose more than 2 pounds of weight or gain more than 2 pounds of weight or as suggested by your caregiver.  You notice sudden swelling of your face, hands, and feet or legs.  You develop belly (abdominal) pain. Round ligament discomfort is a common non-cancerous (benign) cause of abdominal pain in pregnancy. Your caregiver still  must evaluate you.  You develop a severe headache that does not go away.  You develop visual problems, blurred or double vision.  If you have not felt your baby move for more than 1 hour. If you think the baby is not moving as much as usual, eat something with sugar in it and lie down on your left side for an hour. The baby should move at least 4 to 5 times per hour. Call right away if your baby moves less than that.  You fall, are in a car accident, or any kind of trauma.  There is mental or physical violence at home. Document Released: 11/10/2001   Document Revised: 08/10/2012 Document Reviewed: 05/15/2009 [facility] Patient Information 2014 [place], [place]. Braxton Hicks Contractions Pregnancy is commonly associated with contractions of the uterus throughout the pregnancy. Towards the end of pregnancy (32 to 34 weeks), these contractions [facility]) can develop more often and may become more forceful. This is not true labor because these contractions do not result in opening (dilatation) and thinning of the cervix. They are sometimes difficult to tell apart from true labor because these contractions can be forceful and people have different pain tolerances. You should not feel embarrassed if you go to the hospital with false labor. Sometimes, the only way to tell if you are in true labor is for your caregiver to follow the changes in the cervix. How to tell the difference between true and false labor:  False labor.  The contractions of false labor are usually shorter, irregular and not as hard as those of true labor.  They are often felt in the front of the lower abdomen and in the groin.  They may leave with walking around or changing positions while lying down.  They get weaker and are shorter lasting as time goes on.  These contractions are usually irregular.  They do not usually become progressively stronger, regular and closer together as with true labor.  True  labor.  Contractions in true labor last 30 to 70 seconds, become very regular, usually become more intense, and increase in frequency.  They do not go away with walking.  The discomfort is usually felt in the top of the uterus and spreads to the lower abdomen and low back.  True labor can be determined by your caregiver with an exam. This will show that the cervix is dilating and getting thinner. If there are no prenatal problems or other health problems associated with the pregnancy, it is completely safe to be sent home with false labor and await the onset of true labor. HOME CARE INSTRUCTIONS   Keep up with your usual exercises and instructions.  Take medications as directed.  Keep your regular prenatal appointment.  Eat and drink lightly if you think you are going into labor.  If BH contractions are making you uncomfortable:  Change your activity position from lying down or resting to walking/walking to resting.  Sit and rest in a tub of warm water.  Drink 2 to 3 glasses of water. Dehydration may cause B-H contractions.  Do slow and deep breathing several times an hour. SEEK IMMEDIATE MEDICAL CARE IF:   Your contractions continue to become stronger, more regular, and closer together.  You have a gushing, burst or leaking of fluid from the vagina.  An oral temperature above 102 F (38.9 C) develops.  You have passage of blood-tinged mucus.  You develop vaginal bleeding.  You develop continuous belly (abdominal) pain.  You have low back pain that you never had before.  You feel the baby's head pushing down causing pelvic pressure.  The baby is not moving as much as it used to. Document Released: 11/16/2005 Document Revised: 02/08/2012 Document Reviewed: 05/10/2009 [facility] Patient Information 2014 [place], [place].

## 2013-08-08 NOTE — Progress Notes (Signed)
Pulse: 86 Not feeling well in general. Has some sharp pains near her navel.

## 2013-08-09 ENCOUNTER — Encounter: Payer: Self-pay | Admitting: Obstetrics & Gynecology

## 2013-08-10 LAB — CULTURE, OB URINE: Colony Count: 30000

## 2013-08-16 ENCOUNTER — Encounter: Payer: Self-pay | Admitting: *Deleted

## 2013-08-18 ENCOUNTER — Inpatient Hospital Stay (HOSPITAL_COMMUNITY)
Admission: AD | Admit: 2013-08-18 | Discharge: 2013-08-18 | Disposition: A | Discharge status: Home / Self Care | Payer: BC Managed Care – PPO | Source: Ambulatory Visit | Attending: Obstetrics & Gynecology | Admitting: Obstetrics & Gynecology

## 2013-08-18 ENCOUNTER — Encounter (HOSPITAL_COMMUNITY): Payer: Self-pay | Admitting: *Deleted

## 2013-08-18 DIAGNOSIS — R079 Chest pain, unspecified: Secondary | ICD-10-CM | POA: Insufficient documentation

## 2013-08-18 DIAGNOSIS — R0602 Shortness of breath: Secondary | ICD-10-CM

## 2013-08-18 DIAGNOSIS — O99891 Other specified diseases and conditions complicating pregnancy: Secondary | ICD-10-CM | POA: Insufficient documentation

## 2013-08-18 DIAGNOSIS — R200 Anesthesia of skin: Secondary | ICD-10-CM

## 2013-08-18 DIAGNOSIS — R05 Cough: Secondary | ICD-10-CM

## 2013-08-18 DIAGNOSIS — J309 Allergic rhinitis, unspecified: Secondary | ICD-10-CM

## 2013-08-18 DIAGNOSIS — M79609 Pain in unspecified limb: Secondary | ICD-10-CM | POA: Insufficient documentation

## 2013-08-18 DIAGNOSIS — R209 Unspecified disturbances of skin sensation: Secondary | ICD-10-CM | POA: Insufficient documentation

## 2013-08-18 LAB — URINALYSIS, ROUTINE W REFLEX MICROSCOPIC
Glucose, UA: 100 mg/dL — AB
Hgb urine dipstick: NEGATIVE
Protein, ur: NEGATIVE mg/dL

## 2013-08-18 LAB — URINE MICROSCOPIC-ADD ON

## 2013-08-18 NOTE — MAU Provider Note (Signed)
History     CSN: [id number]  Arrival date and time: 08/18/13 1622   None     Chief Complaint  Patient presents with  . Numbness  . Tingling  . Chest Pain   Chest Pain    Carla Sherman is a 37 y.o. G2P1001 at [redacted]w[redacted]d presents for evaluation acute point chest pain of her left chest, left arm and left leg tingling associated with a headache. The symptoms began this morning and  persistent on and off throughout the day. Patient describes pain as 5/10. Patient reports she has had similar symptoms in the past about 5 years ago.    Patient says she has been slightly nauseated throughout the day, without vomiting, no vision changes, no weakness, no shortness of breath, no urinary symptoms, no bowel symptoms, no fevers, no chills, no recent illnesses. Patient denies abnormal discharge  Patient endorses good fetal movement, no loss of fluid, no vaginal bleeding, Braxton Hicks contractions   OB History   Grav Para Term Preterm Abortions TAB SAB Ect Mult Living   2 1 1       1      Obstetric Comments   2011 PP INCREASED BP; ON MED X 1 WEEK      Past Medical History  Diagnosis Date  . Pneumonia 2008  . Pregnancy induced hypertension     PP 2011  . Abnormal Pap smear   . Infection     UTI X 1    Past Surgical History  Procedure Laterality Date  . No past surgeries      Family History  Problem Relation Age of Onset  . Bipolar disorder Mother   . Diabetes Mother   . Hyperlipidemia Mother   . Hypertension Mother   . Arthritis Maternal Aunt   . Cancer Maternal Aunt     BREAST  . Diabetes Maternal Aunt   . Arthritis Maternal Grandmother   . Heart disease Maternal Grandmother   . Cancer Maternal Grandfather     PROSTATE  . Cancer Paternal Grandmother     PANCREATIC  . Dementia Paternal Grandmother   . Cancer Paternal Grandfather     LUNG  . Cancer Maternal Aunt     BREAST  . Early death Maternal Aunt     DIED IN 35'S  . Diabetes Maternal Aunt   . Cancer  Cousin     LYMPHOMA  . Thyroid disease Cousin     History  Substance Use Topics  . Smoking status: Never Smoker   . Smokeless tobacco: Never Used  . Alcohol Use: Yes     Comment: social    Allergies: No Known Allergies  Prescriptions prior to admission  Medication Sig Dispense Refill  . Prenatal Vit-Fe Fumarate-FA (PRENATAL MULTIVITAMIN) TABS tablet Take 1 tablet by mouth at bedtime.        Review of Systems  Cardiovascular: Positive for chest pain.   See above  Physical Exam   Blood pressure 124/64, pulse 78, temperature 98.4 F (36.9 C), temperature source Oral, resp. rate 18, height 5\' 2"  (1.575 m), weight 91.989 kg (202 lb 12.8 oz), last menstrual period 12/26/2012, SpO2 100.00%.  Physical Exam  Constitutional: She is oriented to person, place, and time. She appears well-developed and well-nourished. No distress.  HENT:  Head: Normocephalic and atraumatic.  Eyes: Conjunctivae and EOM are normal. Pupils are equal, round, and reactive to light. Right eye exhibits no discharge. Left eye exhibits no discharge. No scleral icterus.  Neck: Normal range  of motion. Neck supple. No JVD present. No tracheal deviation present. No thyromegaly present.  Cardiovascular: Normal rate, regular rhythm, normal heart sounds and intact distal pulses.  Exam reveals no gallop and no friction rub.   No murmur heard. Respiratory: Effort normal and breath sounds normal. No stridor. No respiratory distress. She has no wheezes. She has no rales. She exhibits no tenderness.  GI: Soft. She exhibits no distension (gravid) and no mass. There is no tenderness. There is no rebound and no guarding.  Musculoskeletal: Normal range of motion. She exhibits no edema and no tenderness.  Lymphadenopathy:    She has no cervical adenopathy.  Neurological: She is alert and oriented to person, place, and time. She displays no atrophy and no tremor. No cranial nerve deficit or sensory deficit. She exhibits normal  muscle tone (Unable to elicit DTRs.). She displays no seizure activity. Coordination and gait normal. GCS eye subscore is 4. GCS verbal subscore is 5. GCS motor subscore is 6.  Skin: Skin is warm and dry. No rash noted. She is not diaphoretic. No erythema. No pallor.  Psychiatric: She has a normal mood and affect. Her behavior is normal. Judgment and thought content normal.    MAU Course  Procedures  MDM Patient had normal EKG with normal sinus rhythm. Patient did have inverted T waves in 3 absent Throughout any of the other leads.   Assessment and Plan  Carla Sherman is a 37 y.o. G2P1001 at [redacted]w[redacted]d for evaluation of leg tingling and arm tingling and chest pain.  Most patients pain/resolved this time. Patient reports no chest pain or arm pain at this time. Patient reports a persistent leg pain  Chest pain: Suspicious that this may be related to acid reflux first muscle spell. Reassuring EKG and low risk factors for PE or cardiac disease. Wells score is extremely low. No evidence of DVT, tachycardia or O2 requirement. Recommended patient try Nexium for the next few days or comments to see if improves chest discomfort  Tingling in arms and legs: Postulated to nerve impingement versus sciatica. Recommend monitoring is improved if worsening may consider additional imaging. Strongly doubt TIA versus CVA given history and lack of symptoms or risk factors.  Patient be discharged home. Discussed that the likelihood of a severe etiology of patient's symptoms itching lobe. Recommend patient return for worsening symptoms or if persistent after 72 hours may consider additional imaging at that time. Patient stated understanding and okay with this plan at time of discharge  Carla Sherman 08/18/2013, 6:32 PM

## 2013-08-18 NOTE — MAU Note (Signed)
Patient presents to MAU with c/o "feeling like heart is being poked"; states feeling comes and goes. Started this am; followed by Numb and tingling feeling in left arm, left leg, and face. Denies any pain at this time. Reports having braxton hicks contractions (per patient). Denies LOF nor bleeding. Reports good fetal movement.

## 2013-08-20 LAB — URINE CULTURE

## 2013-08-21 ENCOUNTER — Telehealth: Payer: Self-pay | Admitting: General Practice

## 2013-08-21 NOTE — Telephone Encounter (Signed)
Per chart review, patient did go to MAU later that day. Called patient to follow up from MAU visit & phone call, patient answered and stated she ended up going to MAU because she did not hear from [place], when asked to verify birth date & SSN patient stated she was on a very important phone call and would have to call us back later.

## 2013-08-21 NOTE — Telephone Encounter (Signed)
Patient called and left message stating she was returning our phone call

## 2013-08-21 NOTE — Telephone Encounter (Signed)
Patient called and left message stating she is 32w pregnant and having a lot of braxton hicks contractions, but now throughout the day she has had a lot of chest pain and her left leg feels numb and now it feels like her arm is trying to go numb as well and would like a call back with advice.

## 2013-08-22 ENCOUNTER — Ambulatory Visit (INDEPENDENT_AMBULATORY_CARE_PROVIDER_SITE_OTHER): Payer: BC Managed Care – PPO | Admitting: Family Medicine

## 2013-08-22 VITALS — BP 106/70 | Temp 97.8°F | Wt 201.9 lb

## 2013-08-22 DIAGNOSIS — O09529 Supervision of elderly multigravida, unspecified trimester: Secondary | ICD-10-CM

## 2013-08-22 DIAGNOSIS — N898 Other specified noninflammatory disorders of vagina: Secondary | ICD-10-CM

## 2013-08-22 DIAGNOSIS — Z3483 Encounter for supervision of other normal pregnancy, third trimester: Secondary | ICD-10-CM

## 2013-08-22 DIAGNOSIS — O09523 Supervision of elderly multigravida, third trimester: Secondary | ICD-10-CM

## 2013-08-22 LAB — POCT URINALYSIS DIP (DEVICE)
Glucose, UA: 100 mg/dL — AB
Ketones, ur: NEGATIVE mg/dL
Nitrite: NEGATIVE
Protein, ur: NEGATIVE mg/dL
Specific Gravity, Urine: 1.02 (ref 1.005–1.030)
pH: 7 (ref 5.0–8.0)

## 2013-08-22 MED ORDER — FLUCONAZOLE 150 MG PO TABS: 150.0000 mg | ORAL_TABLET | Freq: Once | ORAL | Status: DC

## 2013-08-22 NOTE — Progress Notes (Signed)
37 yo G2P1001 @ [redacted]w[redacted]d here for rob visit.  - was seen in the MAU on Friday for chest pain- reassurance given - irregular ctx. No lof, vb. +FM - ahving vaginal itching, swelling and discharge  O: see flowsheet  A/P:  - in review of chart, has never had an anatomy US Had early 7wk [place] for dating but no other US - anatomy US ordered today - treat for yeast with diflucan based on irritation and dicharge on exam - SVE as above - PTL precautions discussed -f/u in 2 weeks for gbs/cx that day

## 2013-08-22 NOTE — Patient Instructions (Signed)

## 2013-08-22 NOTE — Progress Notes (Signed)
Pulse- 76 Patient reports recent headaches and pelvic pressure; patient reports labia itches and are swollen; still experiencing some contractions

## 2013-08-22 NOTE — Telephone Encounter (Signed)
Patient has visit this afternoon.

## 2013-08-23 ENCOUNTER — Ambulatory Visit (HOSPITAL_COMMUNITY): Admission: RE | Admit: 2013-08-23 | Payer: BC Managed Care – PPO | Source: Ambulatory Visit

## 2013-08-23 ENCOUNTER — Ambulatory Visit (HOSPITAL_COMMUNITY)
Admission: RE | Admit: 2013-08-23 | Discharge: 2013-08-23 | Disposition: A | Discharge status: Home / Self Care | Payer: BC Managed Care – PPO | Source: Ambulatory Visit | Attending: Family Medicine | Admitting: Family Medicine

## 2013-08-23 ENCOUNTER — Other Ambulatory Visit: Payer: Self-pay | Admitting: Family Medicine

## 2013-08-23 DIAGNOSIS — O09529 Supervision of elderly multigravida, unspecified trimester: Secondary | ICD-10-CM | POA: Insufficient documentation

## 2013-08-23 DIAGNOSIS — O0930 Supervision of pregnancy with insufficient antenatal care, unspecified trimester: Secondary | ICD-10-CM | POA: Insufficient documentation

## 2013-08-23 DIAGNOSIS — Z3483 Encounter for supervision of other normal pregnancy, third trimester: Secondary | ICD-10-CM

## 2013-08-23 DIAGNOSIS — O09523 Supervision of elderly multigravida, third trimester: Secondary | ICD-10-CM

## 2013-08-23 DIAGNOSIS — Z3689 Encounter for other specified antenatal screening: Secondary | ICD-10-CM | POA: Insufficient documentation

## 2013-08-23 LAB — WET PREP, GENITAL

## 2013-09-05 ENCOUNTER — Ambulatory Visit (INDEPENDENT_AMBULATORY_CARE_PROVIDER_SITE_OTHER): Payer: BC Managed Care – PPO | Admitting: Family Medicine

## 2013-09-05 VITALS — BP 100/71 | Temp 97.0°F | Wt 202.1 lb

## 2013-09-05 DIAGNOSIS — Z3493 Encounter for supervision of normal pregnancy, unspecified, third trimester: Secondary | ICD-10-CM | POA: Insufficient documentation

## 2013-09-05 DIAGNOSIS — O09529 Supervision of elderly multigravida, unspecified trimester: Secondary | ICD-10-CM

## 2013-09-05 DIAGNOSIS — Z23 Encounter for immunization: Secondary | ICD-10-CM

## 2013-09-05 DIAGNOSIS — O09523 Supervision of elderly multigravida, third trimester: Secondary | ICD-10-CM

## 2013-09-05 LAB — POCT URINALYSIS DIP (DEVICE)
Hgb urine dipstick: NEGATIVE
Ketones, ur: NEGATIVE mg/dL
Protein, ur: 30 mg/dL — AB
Specific Gravity, Urine: 1.02 (ref 1.005–1.030)
Urobilinogen, UA: 0.2 mg/dL (ref 0.0–1.0)
pH: 7 (ref 5.0–8.0)

## 2013-09-05 LAB — OB RESULTS CONSOLE GC/CHLAMYDIA: Gonorrhea: NEGATIVE

## 2013-09-05 NOTE — Progress Notes (Signed)
P-96 

## 2013-09-05 NOTE — Addendum Note (Signed)
Addended by: Marchelle Folks L on: 09/05/2013 05:00 PM   Modules accepted: Orders

## 2013-09-05 NOTE — Progress Notes (Signed)
  Subjective:    Carla Sherman is a 37 y.o. female being seen today for her obstetrical visit. She is at [redacted]w[redacted]d gestation. Patient reports no bleeding, no contractions, no cramping and no leaking. Fetal movement: normal.  Menstrual History: OB History   Grav Para Term Preterm Abortions TAB SAB Ect Mult Living   2 1 1       1      Obstetric Comments   2011 PP INCREASED BP; ON MED X 1 WEEK        Patient's last menstrual period was 12/26/2012.    The following portions of the patient's history were reviewed and updated as appropriate: allergies, current medications, past family history, past medical history, past social history, past surgical history and problem list.  Review of Systems Pertinent items are noted in HPI.   Objective:    BP 100/71  Temp(Src) 97 F (36.1 C)  Wt 91.672 kg (202 lb 1.6 oz)  BMI 36.96 kg/m2  LMP 12/26/2012 FHT:  160 BPM  Uterine Size: 37 cm and size equals dates  Presentation: breech     Assessment:   Carla Sherman is a 37 y.o. G2P1001 at [redacted]w[redacted]d  here for ROB visit.  Discussed with Patient:  -Plans to breast feed.  All questions answered. -Continue prenatal vitamins. -Reviewed fetal kick counts Pt to perform daily at a time when the baby is active, lie laterally with both hands on belly in quiet room and count all movements (hiccups, shoulder rolls, obvious kicks, etc); pt is to report to clinic L&D for less than 10 movements felt in a one hour time period-pt told as soon as she counts 10 movements the count is complete.  - Routine precautions discussed (depression, infection s/s).   Patient provided with all pertinent phone numbers for emergencies. - RTC for any VB, regular, painful cramps/ctxs occurring at a rate of >2/10 min, fever (100.5 or higher), n/v/d, any pain that is unresolving or worsening, LOF, decreased fetal movement, CP, SOB, edema - RTC in 2 weeks for next appt. - Did GBS swabs today and will f/u results and call if  abnormal.  Contact#:  Pt has no drug allergies, so will get PCN if GBS+ OR will get sensitivities on GBS swab as pt is PCN-allergic.  Problems: Patient Active Problem List   Diagnosis Date Noted  . Third trimester pregnancy 09/05/2013  . AMA (advanced maternal age) multigravida 35+ 07/18/2013  . Irregular menses 12/28/2012  . 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 Labor bag packed 3. eval fetal position at next visit. Pt breech at 34wk [place]  [ ]  Vaccines: Flu: 7Oct  Tdap: rec'd [ ]  BCM: Vasectomy [ ]  Readiness: baby has a place to sleep, car seat, other baby necessities.  Edu: [x ] TL precautions; [ ]  BF class; [ ]  childbirth class; [ ]   BF counseling;

## 2013-09-05 NOTE — Patient Instructions (Signed)

## 2013-09-06 LAB — GC/CHLAMYDIA PROBE AMP: CT Probe RNA: NEGATIVE

## 2013-09-09 LAB — CULTURE, BETA STREP (GROUP B ONLY)

## 2013-09-10 ENCOUNTER — Encounter: Payer: Self-pay | Admitting: Family Medicine

## 2013-09-13 ENCOUNTER — Ambulatory Visit (INDEPENDENT_AMBULATORY_CARE_PROVIDER_SITE_OTHER): Payer: BC Managed Care – PPO | Admitting: Family Medicine

## 2013-09-13 VITALS — BP 109/68 | Temp 98.6°F | Wt 203.2 lb

## 2013-09-13 DIAGNOSIS — Z3493 Encounter for supervision of normal pregnancy, unspecified, third trimester: Secondary | ICD-10-CM

## 2013-09-13 DIAGNOSIS — O09529 Supervision of elderly multigravida, unspecified trimester: Secondary | ICD-10-CM

## 2013-09-13 DIAGNOSIS — N970 Female infertility associated with anovulation: Secondary | ICD-10-CM

## 2013-09-13 LAB — POCT URINALYSIS DIP (DEVICE)
Bilirubin Urine: NEGATIVE
Hgb urine dipstick: NEGATIVE
Nitrite: NEGATIVE
Protein, ur: 30 mg/dL — AB
Urobilinogen, UA: 0.2 mg/dL (ref 0.0–1.0)
pH: 7 (ref 5.0–8.0)

## 2013-09-13 NOTE — Progress Notes (Signed)
  Subjective:    Carla Sherman is a 37 y.o. female being seen today for her obstetrical visit. She is at [redacted]w[redacted]d gestation. Patient reports no bleeding, no cramping, no leaking and occasional contractions. Fetal movement: normal.  Menstrual History: OB History   Grav Para Term Preterm Abortions TAB SAB Ect Mult Living   2 1 1       1      Obstetric Comments   2011 PP INCREASED BP; ON MED X 1 WEEK       Patient's last menstrual period was 12/26/2012.    The following portions of the patient's history were reviewed and updated as appropriate: allergies, current medications, past family history, past medical history, past social history, past surgical history and problem list.  Review of Systems Pertinent items are noted in HPI.   Objective:    BP 109/68  Temp(Src) 98.6 F (37 C)  Wt 92.171 kg (203 lb 3.2 oz)  BMI 37.16 kg/m2  LMP 12/26/2012 FHT: 150 BPM  Uterine Size: 38 cm and size equals dates  Presentations: cephalic  Pelvic Exam:              Dilation: 1cm       Effacement: 25%             Station:  -3    Consistency: soft            Position: posterior     Assessment:   Carla Sherman is a 37 y.o. G2P1001 at [redacted]w[redacted]d by L=*** here for ROB visit.    Discussed with Patient:  - Plans to breast feed.  All questions answered. - Continue prenatal vitamins. - Reviewed fetal kick counts Pt to perform daily at a time when the baby is active, lie laterally with both hands on belly in quiet room and count all movements (hiccups, shoulder rolls, obvious kicks, etc); pt is to report to clinic MAU for less than 10 movements felt in a 2 hour time period-pt told as soon as she counts 10 movements the count is complete.  - Routine precautions discussed (depression, infection s/s).   Patient provided with all pertinent phone numbers for emergencies. - RTC for any VB, regular, painful cramps/ctxs occurring at a rate of >2/10 min, fever (100.5 or higher), n/v/d, any pain that  is unresolving or worsening, LOF, decreased fetal movement, CP, SOB, edema -RTC in one week for next visit.  Problems: Patient Active Problem List   Diagnosis Date Noted  . Third trimester pregnancy 09/05/2013  . AMA (advanced maternal age) multigravida 35+ 07/18/2013  . Irregular menses 12/28/2012  . 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 NTD  [ ]  Vaccines: JYN:WGNF  Tdap: recd [ ]  BCM:  [ ]  Readiness: baby has a place to sleep, car seat, other baby necessities.  Edu: [x ] PTL precautions; [ ]  BF class; [ ]  childbirth class; [ ]   BF counseling;

## 2013-09-13 NOTE — Progress Notes (Signed)
Pulse-79   Edema-vaginal/ lower abd

## 2013-09-13 NOTE — Patient Instructions (Signed)

## 2013-09-19 ENCOUNTER — Ambulatory Visit (INDEPENDENT_AMBULATORY_CARE_PROVIDER_SITE_OTHER): Payer: BC Managed Care – PPO | Admitting: Family Medicine

## 2013-09-19 VITALS — BP 111/71 | Temp 97.3°F | Wt 203.6 lb

## 2013-09-19 DIAGNOSIS — O09529 Supervision of elderly multigravida, unspecified trimester: Secondary | ICD-10-CM

## 2013-09-19 DIAGNOSIS — Z3493 Encounter for supervision of normal pregnancy, unspecified, third trimester: Secondary | ICD-10-CM

## 2013-09-19 LAB — POCT URINALYSIS DIP (DEVICE)
Hgb urine dipstick: NEGATIVE
Ketones, ur: NEGATIVE mg/dL
Protein, ur: 30 mg/dL — AB
pH: 6.5 (ref 5.0–8.0)

## 2013-09-19 NOTE — Progress Notes (Signed)
  Subjective:    Carla Sherman is a 37 y.o. female being seen today for her obstetrical visit. She is at [redacted]w[redacted]d gestation. Patient reports no bleeding, no cramping, no leaking and occasional contractions. Fetal movement: normal.  Menstrual History: OB History   Grav Para Term Preterm Abortions TAB SAB Ect Mult Living   2 1 1       1      Obstetric Comments   2011 PP INCREASED BP; ON MED X 1 WEEK       Patient's last menstrual period was 12/26/2012.    The following portions of the patient's history were reviewed and updated as appropriate: allergies, current medications, past family history, past medical history, past social history, past surgical history and problem list.  Review of Systems Pertinent items are noted in HPI.   Objective:    BP 111/71  Temp(Src) 97.3 F (36.3 C)  Wt 92.352 kg (203 lb 9.6 oz)  BMI 37.23 kg/m2  LMP 12/26/2012 FHT: 140s BPM  Uterine Size: 38 cm and size equals dates  Presentations: unsure  Pelvic Exam:              Dilation: 1cm       Effacement: Long             Station:  -3    Consistency: soft            Position: anterior     Assessment:   Carla Sherman is a 37 y.o. G2P1001 at [redacted]w[redacted]d here for ROB visit.    Discussed with Patient:  - Plans to breast/bottle feed.  All questions answered. - Continue prenatal vitamins. - Reviewed fetal kick counts Pt to perform daily at a time when the baby is active, lie laterally with both hands on belly in quiet room and count all movements (hiccups, shoulder rolls, obvious kicks, etc); pt is to report to clinic MAU for less than 10 movements felt in a 2 hour time period-pt told as soon as she counts 10 movements the count is complete.  - Routine precautions discussed (depression, infection s/s).   Patient provided with all pertinent phone numbers for emergencies. - RTC for any VB, regular, painful cramps/ctxs occurring at a rate of >2/10 min, fever (100.5 or higher), n/v/d, any pain that  is unresolving or worsening, LOF, decreased fetal movement, CP, SOB, edema -RTC in one week for next visit.  Problems: Patient Active Problem List   Diagnosis Date Noted  . Third trimester pregnancy 09/05/2013  . AMA (advanced maternal age) multigravida 35+ 07/18/2013  . Irregular menses 12/28/2012  . 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   [ ]  Vaccines: Flu: rec'd  Tdap: rec'd [ ]  BCM: Vasectomy [ ]  Readiness: baby has a place to sleep, car seat, other baby necessities.  Edu: [ ]  PTL precautions; [ ]  BF class; [ ]  childbirth class; [ ]   BF counseling;

## 2013-09-19 NOTE — Progress Notes (Signed)
Pulse- 80  Pain/pressure- lower abd Mucous plug came out

## 2013-09-19 NOTE — Patient Instructions (Signed)

## 2013-09-20 ENCOUNTER — Encounter (HOSPITAL_COMMUNITY): Payer: Self-pay | Admitting: *Deleted

## 2013-09-20 ENCOUNTER — Inpatient Hospital Stay (HOSPITAL_COMMUNITY)
Admission: AD | Admit: 2013-09-20 | Discharge: 2013-09-20 | Disposition: A | Discharge status: Home / Self Care | Payer: BC Managed Care – PPO | Source: Ambulatory Visit | Attending: Obstetrics and Gynecology | Admitting: Obstetrics and Gynecology

## 2013-09-20 DIAGNOSIS — Z22330 Carrier of Group B streptococcus: Secondary | ICD-10-CM | POA: Insufficient documentation

## 2013-09-20 DIAGNOSIS — O479 False labor, unspecified: Secondary | ICD-10-CM | POA: Insufficient documentation

## 2013-09-20 DIAGNOSIS — O4290 Premature rupture of membranes, unspecified as to length of time between rupture and onset of labor, unspecified weeks of gestation: Secondary | ICD-10-CM | POA: Insufficient documentation

## 2013-09-20 DIAGNOSIS — O09529 Supervision of elderly multigravida, unspecified trimester: Secondary | ICD-10-CM | POA: Insufficient documentation

## 2013-09-20 DIAGNOSIS — O99891 Other specified diseases and conditions complicating pregnancy: Secondary | ICD-10-CM | POA: Insufficient documentation

## 2013-09-20 DIAGNOSIS — N898 Other specified noninflammatory disorders of vagina: Secondary | ICD-10-CM

## 2013-09-20 DIAGNOSIS — O9989 Other specified diseases and conditions complicating pregnancy, childbirth and the puerperium: Secondary | ICD-10-CM

## 2013-09-20 LAB — WET PREP, GENITAL: Clue Cells Wet Prep HPF POC: NONE SEEN

## 2013-09-20 LAB — POCT FERN TEST: POCT Fern Test: NEGATIVE

## 2013-09-20 NOTE — MAU Note (Signed)
Patient states she has had a little leaking but is not wearing a pad and no active leaking. States some mild contractions. Reports good fetal movement.

## 2013-09-20 NOTE — MAU Note (Signed)
Patient states she started leaking 10-21 at 1700. Not consistent and not enough to wear a pad.

## 2013-09-20 NOTE — MAU Provider Note (Signed)
Chief Complaint:  Rupture of Membranes   Carla Sherman is a 37 y.o.  G2P1001 with IUP at [redacted]w[redacted]d presenting for Rupture of Membranes  Pt states that last night she noticed some wetness on her underwear. Continued over the course of the night to have damp spots on her underwear but nothing that saturated through or required a panty liner.  Her mother told her it could have been ROM so they came here to be evaluated.  She denies any regular contractions. No vb. +FM.   PNC at [facility] complicated by ama.  No other abnormalities.  GBS +.     Menstrual History: OB History   Grav Para Term Preterm Abortions TAB SAB Ect Mult Living   2 1 1       1      Obstetric Comments   2011 PP INCREASED BP; ON MED X 1 WEEK       Patient's last menstrual period was 12/26/2012.      Past Medical History  Diagnosis Date  . Pneumonia 2008  . Pregnancy induced hypertension     PP 2011  . Abnormal Pap smear   . Infection     UTI X 1    Past Surgical History  Procedure Laterality Date  . No past surgeries      Family History  Problem Relation Age of Onset  . Bipolar disorder Mother   . Diabetes Mother   . Hyperlipidemia Mother   . Hypertension Mother   . Arthritis Maternal Aunt   . Cancer Maternal Aunt     BREAST  . Diabetes Maternal Aunt   . Arthritis Maternal Grandmother   . Heart disease Maternal Grandmother   . Cancer Maternal Grandfather     PROSTATE  . Cancer Paternal Grandmother     PANCREATIC  . Dementia Paternal Grandmother   . Cancer Paternal Grandfather     LUNG  . Cancer Maternal Aunt     BREAST  . Early death Maternal Aunt     DIED IN 70'S  . Diabetes Maternal Aunt   . Cancer Cousin     LYMPHOMA  . Thyroid disease Cousin     History  Substance Use Topics  . Smoking status: Never Smoker   . Smokeless tobacco: Never Used  . Alcohol Use: Yes     Comment: social     No Known Allergies  Prescriptions prior to admission  Medication Sig Dispense Refill  .  fluconazole (DIFLUCAN) 150 MG tablet Take 1 tablet (150 mg total) by mouth once.  1 tablet  1  . Prenatal Vit-Fe Fumarate-FA (PRENATAL MULTIVITAMIN) TABS tablet Take 1 tablet by mouth at bedtime.        Review of Systems - Negative except for what is mentioned in HPI.  Physical Exam  Blood pressure 100/63, pulse 85, temperature 98.4 F (36.9 C), temperature source Oral, resp. rate 20, height 5\' 2"  (1.575 m), weight 92.715 kg (204 lb 6.4 oz), last menstrual period 12/26/2012, SpO2 99.00%. GENERAL: Well-developed, well-nourished female in no acute distress.  LUNGS: Clear to auscultation bilaterally.  HEART: Regular rate and rhythm. ABDOMEN: Soft, nontender, nondistended, gravid.  SSE: some thin normal discharge in the vault. No pooling.  Neg fern. Normal vagina, cervix, urethra.  EXTREMITIES: Nontender, no edema, 2+ distal pulses. Cervical Exam: Dilatation 1 cm   Effacement 20 %   Station -2   Presentation: cephalic FHT:  Baseline rate 140 bpm   Variability moderate  Accelerations present   Decelerations  none Contractions: one on toco   Labs: No results found for this or any previous visit (from the past 24 hour(s)).  Imaging Studies:     Assessment: Carla Sherman is  37 y.o. G2P1001 at [redacted]w[redacted]d presents with Rupture of Membranes .  Plan: 1) r/o rupture - fern and pool negative - not a good story for rupture - reassurance given - labor precautions discussed - wet prep normal  2) fwb - cat I tracing - gbs pos.   3_ d/c to home   Fletcher Rathbun L 10/22/20143:15 PM

## 2013-09-22 NOTE — MAU Provider Note (Signed)
Attestation of Attending Supervision of Advanced Practitioner (CNM/NP): Evaluation and management procedures were performed by the Advanced Practitioner under my supervision and collaboration.  I have reviewed the Advanced Practitioner's note and chart, and I agree with the management and plan.  Debbe Crumble 09/22/2013 12:17 PM

## 2013-09-23 ENCOUNTER — Inpatient Hospital Stay (HOSPITAL_COMMUNITY)
Admission: AD | Admit: 2013-09-23 | Discharge: 2013-09-23 | Disposition: A | Discharge status: Home / Self Care | Payer: BC Managed Care – PPO | Source: Ambulatory Visit | Attending: Obstetrics & Gynecology | Admitting: Obstetrics & Gynecology

## 2013-09-23 ENCOUNTER — Encounter (HOSPITAL_COMMUNITY): Payer: Self-pay | Admitting: *Deleted

## 2013-09-23 DIAGNOSIS — O09523 Supervision of elderly multigravida, third trimester: Secondary | ICD-10-CM

## 2013-09-23 DIAGNOSIS — Z3493 Encounter for supervision of normal pregnancy, unspecified, third trimester: Secondary | ICD-10-CM

## 2013-09-23 DIAGNOSIS — O479 False labor, unspecified: Secondary | ICD-10-CM | POA: Insufficient documentation

## 2013-09-23 NOTE — MAU Note (Signed)
Pt presents stating that she was having strong contractions for a couple of hours this morning that went away after she rested for awhile. She has noticed a vaginal discharge and she was told to come to MAU and be evaluated.

## 2013-09-26 ENCOUNTER — Ambulatory Visit (INDEPENDENT_AMBULATORY_CARE_PROVIDER_SITE_OTHER): Payer: BC Managed Care – PPO | Admitting: Family Medicine

## 2013-09-26 ENCOUNTER — Encounter: Payer: Self-pay | Admitting: Family Medicine

## 2013-09-26 VITALS — BP 91/63 | Temp 97.9°F | Wt 205.7 lb

## 2013-09-26 DIAGNOSIS — O09529 Supervision of elderly multigravida, unspecified trimester: Secondary | ICD-10-CM

## 2013-09-26 DIAGNOSIS — O09523 Supervision of elderly multigravida, third trimester: Secondary | ICD-10-CM

## 2013-09-26 DIAGNOSIS — Z3493 Encounter for supervision of normal pregnancy, unspecified, third trimester: Secondary | ICD-10-CM

## 2013-09-26 LAB — POCT URINALYSIS DIP (DEVICE)
Bilirubin Urine: NEGATIVE
Glucose, UA: NEGATIVE mg/dL
Ketones, ur: NEGATIVE mg/dL
Protein, ur: 30 mg/dL — AB
Specific Gravity, Urine: >=1.03 (ref 1.005–1.030)
Urobilinogen, UA: 0.2 mg/dL (ref 0.0–1.0)
pH: 6.5 (ref 5.0–8.0)

## 2013-09-26 NOTE — Progress Notes (Signed)
Pt is a 37 yo G2P1001 @ [redacted]w[redacted]d  -irregular occasional contractions - no lof, vb. +FM  O: see flowsheet  A/P - labor precautions discussed Will need post dates testing starting next week. Discussed with pt - f/u in 1 week

## 2013-09-26 NOTE — Progress Notes (Signed)
P= 72  Pressure/pain with irritable contractions/increased activity

## 2013-09-27 ENCOUNTER — Inpatient Hospital Stay (HOSPITAL_COMMUNITY)
Admission: AD | Admit: 2013-09-27 | Discharge: 2013-10-01 | DRG: 765 | Disposition: A | Discharge status: Home / Self Care | Payer: BC Managed Care – PPO | Source: Ambulatory Visit | Attending: Obstetrics & Gynecology | Admitting: Obstetrics & Gynecology

## 2013-09-27 ENCOUNTER — Inpatient Hospital Stay (HOSPITAL_COMMUNITY): Payer: BC Managed Care – PPO | Admitting: Anesthesiology

## 2013-09-27 ENCOUNTER — Encounter (HOSPITAL_COMMUNITY): Payer: BC Managed Care – PPO | Admitting: Anesthesiology

## 2013-09-27 ENCOUNTER — Encounter (HOSPITAL_COMMUNITY): Payer: Self-pay | Admitting: *Deleted

## 2013-09-27 DIAGNOSIS — O324XX0 Maternal care for high head at term, not applicable or unspecified: Secondary | ICD-10-CM | POA: Diagnosis present

## 2013-09-27 DIAGNOSIS — O4410 Placenta previa with hemorrhage, unspecified trimester: Secondary | ICD-10-CM | POA: Diagnosis present

## 2013-09-27 DIAGNOSIS — O9903 Anemia complicating the puerperium: Secondary | ICD-10-CM | POA: Diagnosis not present

## 2013-09-27 DIAGNOSIS — O4290 Premature rupture of membranes, unspecified as to length of time between rupture and onset of labor, unspecified weeks of gestation: Principal | ICD-10-CM | POA: Diagnosis present

## 2013-09-27 DIAGNOSIS — D649 Anemia, unspecified: Secondary | ICD-10-CM | POA: Diagnosis not present

## 2013-09-27 DIAGNOSIS — Z22330 Carrier of Group B streptococcus: Secondary | ICD-10-CM

## 2013-09-27 DIAGNOSIS — Z3493 Encounter for supervision of normal pregnancy, unspecified, third trimester: Secondary | ICD-10-CM

## 2013-09-27 DIAGNOSIS — O99892 Other specified diseases and conditions complicating childbirth: Secondary | ICD-10-CM | POA: Diagnosis present

## 2013-09-27 DIAGNOSIS — O09523 Supervision of elderly multigravida, third trimester: Secondary | ICD-10-CM

## 2013-09-27 DIAGNOSIS — O09529 Supervision of elderly multigravida, unspecified trimester: Secondary | ICD-10-CM | POA: Diagnosis present

## 2013-09-27 LAB — TYPE AND SCREEN
ABO/RH(D): A POS
Antibody Screen: NEGATIVE

## 2013-09-27 LAB — CBC
Hemoglobin: 11.2 g/dL — ABNORMAL LOW (ref 12.0–15.0)
MCH: 28.4 pg (ref 26.0–34.0)
MCV: 83.3 fL (ref 78.0–100.0)
Platelets: 254 10*3/uL (ref 150–400)
RBC: 3.95 MIL/uL (ref 3.87–5.11)
WBC: 7.3 10*3/uL (ref 4.0–10.5)

## 2013-09-27 LAB — OB RESULTS CONSOLE ABO/RH: RH Type: POSITIVE

## 2013-09-27 LAB — ABO/RH: ABO/RH(D): A POS

## 2013-09-27 MED ORDER — IBUPROFEN 600 MG PO TABS: 600.0000 mg | ORAL_TABLET | Freq: Four times a day (QID) | ORAL | Status: DC | PRN

## 2013-09-27 MED ORDER — OXYTOCIN 40 UNITS IN LACTATED RINGERS INFUSION - SIMPLE MED: 62.5000 mL/h | INTRAVENOUS | Status: DC

## 2013-09-27 MED ORDER — ACETAMINOPHEN 325 MG PO TABS: 650.0000 mg | ORAL_TABLET | ORAL | Status: DC | PRN

## 2013-09-27 MED ORDER — LACTATED RINGERS IV SOLN
INTRAVENOUS | Status: DC
  Administered 2013-09-27 – 2013-09-28 (×3): via INTRAVENOUS

## 2013-09-27 MED ORDER — PHENYLEPHRINE 40 MCG/ML (10ML) SYRINGE FOR IV PUSH (FOR BLOOD PRESSURE SUPPORT): 80.0000 ug | PREFILLED_SYRINGE | INTRAVENOUS | Status: DC | PRN

## 2013-09-27 MED ORDER — TERBUTALINE SULFATE 1 MG/ML IJ SOLN: 0.2500 mg | Freq: Once | INTRAMUSCULAR | Status: AC | PRN

## 2013-09-27 MED ORDER — LACTATED RINGERS IV SOLN
500.0000 mL | INTRAVENOUS | Status: DC | PRN
  Administered 2013-09-28: 500 mL via INTRAVENOUS
  Administered 2013-09-28: 200 mL via INTRAVENOUS

## 2013-09-27 MED ORDER — FENTANYL 2.5 MCG/ML BUPIVACAINE 1/10 % EPIDURAL INFUSION (WH - ANES)
14.0000 mL/h | INTRAMUSCULAR | Status: DC | PRN
  Administered 2013-09-27 – 2013-09-28 (×2): 14 mL/h via EPIDURAL
  Filled 2013-09-27 (×3): qty 125

## 2013-09-27 MED ORDER — OXYTOCIN 40 UNITS IN LACTATED RINGERS INFUSION - SIMPLE MED
1.0000 m[IU]/min | INTRAVENOUS | Status: DC
  Administered 2013-09-27: 2 m[IU]/min via INTRAVENOUS
  Filled 2013-09-27: qty 1000

## 2013-09-27 MED ORDER — OXYCODONE-ACETAMINOPHEN 5-325 MG PO TABS: 1.0000 | ORAL_TABLET | ORAL | Status: DC | PRN

## 2013-09-27 MED ORDER — EPHEDRINE 5 MG/ML INJ
10.0000 mg | INTRAVENOUS | Status: DC | PRN
  Filled 2013-09-27 (×2): qty 4

## 2013-09-27 MED ORDER — CITRIC ACID-SODIUM CITRATE 334-500 MG/5ML PO SOLN
30.0000 mL | ORAL | Status: DC | PRN
  Administered 2013-09-28: 30 mL via ORAL
  Filled 2013-09-27: qty 15

## 2013-09-27 MED ORDER — ONDANSETRON HCL 4 MG/2ML IJ SOLN
4.0000 mg | Freq: Four times a day (QID) | INTRAMUSCULAR | Status: DC | PRN
  Administered 2013-09-28: 4 mg via INTRAVENOUS
  Filled 2013-09-27: qty 2

## 2013-09-27 MED ORDER — SODIUM BICARBONATE 8.4 % IV SOLN
INTRAVENOUS | Status: DC | PRN
  Administered 2013-09-27: 5 mL via EPIDURAL
  Administered 2013-09-28 (×2): 10 mL via EPIDURAL
  Administered 2013-09-28: 5 mL via EPIDURAL

## 2013-09-27 MED ORDER — EPHEDRINE 5 MG/ML INJ
10.0000 mg | INTRAVENOUS | Status: DC | PRN
  Administered 2013-09-28: 10 mg via INTRAVENOUS

## 2013-09-27 MED ORDER — PENICILLIN G POTASSIUM 5000000 UNITS IJ SOLR
5.0000 10*6.[IU] | Freq: Once | INTRAVENOUS | Status: AC
  Administered 2013-09-27: 5 10*6.[IU] via INTRAVENOUS
  Filled 2013-09-27: qty 5

## 2013-09-27 MED ORDER — PENICILLIN G POTASSIUM 5000000 UNITS IJ SOLR
2.5000 10*6.[IU] | INTRAVENOUS | Status: DC
  Administered 2013-09-27 – 2013-09-28 (×6): 2.5 10*6.[IU] via INTRAVENOUS
  Filled 2013-09-27 (×12): qty 2.5

## 2013-09-27 MED ORDER — PHENYLEPHRINE 40 MCG/ML (10ML) SYRINGE FOR IV PUSH (FOR BLOOD PRESSURE SUPPORT)
80.0000 ug | PREFILLED_SYRINGE | INTRAVENOUS | Status: DC | PRN
  Filled 2013-09-27: qty 10

## 2013-09-27 MED ORDER — LACTATED RINGERS IV SOLN: 500.0000 mL | Freq: Once | INTRAVENOUS | Status: DC

## 2013-09-27 MED ORDER — DIPHENHYDRAMINE HCL 50 MG/ML IJ SOLN: 12.5000 mg | INTRAMUSCULAR | Status: DC | PRN

## 2013-09-27 MED ORDER — OXYTOCIN BOLUS FROM INFUSION: 500.0000 mL | INTRAVENOUS | Status: DC

## 2013-09-27 MED ORDER — LIDOCAINE HCL (PF) 1 % IJ SOLN: 30.0000 mL | INTRAMUSCULAR | Status: DC | PRN

## 2013-09-27 NOTE — Progress Notes (Signed)
Carla Sherman is a 37 y.o. G2P1001 at [redacted]w[redacted]d   Subjective: Pt is comfortable. Requests epidural.  Objective: BP 129/88  Pulse 76  Temp(Src) 99.2 F (37.3 C) (Oral)  Resp 20  Ht 5\' 2"  (1.575 m)  Wt 92.987 kg (205 lb)  BMI 37.49 kg/m2  LMP 12/26/2012      FHT:  FHR: 145 bpm, variability: moderate,  accelerations:  Present,  decelerations:  Absent UC:   irregular, every 2-5 minutes SVE:   Dilation: 3 Effacement (%): 20 Station: -3 Exam by:: Dr Reola Calkins   Labs: Lab Results  Component Value Date   WBC 7.3 09/27/2013   HGB 11.2* 09/27/2013   HCT 32.9* 09/27/2013   MCV 83.3 09/27/2013   PLT 254 09/27/2013    Assessment / Plan: Induction of labor in progress PROM x 5.5 hours  Continue to monitor Will have epidural placed  Carla Sherman 09/27/2013, 9:24 PM

## 2013-09-27 NOTE — Anesthesia Preprocedure Evaluation (Signed)
Anesthesia Evaluation  Patient identified by MRN, date of birth, ID band Patient awake    Reviewed: Allergy & Precautions, H&P , Patient's Chart, lab work & pertinent test results  Airway Mallampati: II TM Distance: >3 FB Neck ROM: full    Dental  (+) Teeth Intact   Pulmonary  breath sounds clear to auscultation        Cardiovascular hypertension (Resolved PIH, No Meds, no Mg+2), Rhythm:regular Rate:Normal     Neuro/Psych    GI/Hepatic   Endo/Other  Morbid obesity  Renal/GU      Musculoskeletal   Abdominal   Peds  Hematology   Anesthesia Other Findings       Reproductive/Obstetrics (+) Pregnancy                           Anesthesia Physical Anesthesia Plan  ASA: III  Anesthesia Plan: Epidural   Post-op Pain Management:    Induction:   Airway Management Planned:   Additional Equipment:   Intra-op Plan:   Post-operative Plan:   Informed Consent: I have reviewed the patients History and Physical, chart, labs and discussed the procedure including the risks, benefits and alternatives for the proposed anesthesia with the patient or authorized representative who has indicated his/her understanding and acceptance.   Dental Advisory Given  Plan Discussed with:   Anesthesia Plan Comments: (Labs checked- platelets confirmed with RN in room. Fetal heart tracing, per RN, reported to be stable enough for sitting procedure. Discussed epidural, and patient consents to the procedure:  included risk of possible headache,backache, failed block, allergic reaction, and nerve injury. This patient was asked if she had any questions or concerns before the procedure started. )        Anesthesia Quick Evaluation

## 2013-09-27 NOTE — Anesthesia Procedure Notes (Signed)

## 2013-09-27 NOTE — H&P (Signed)
Carla Sherman is a 37 y.o. female G2P1001 at 66w2 by 6week [place] presenting for PROM this afternoon around 4pm. Per pt was sitting in chair when felt a large gush of fluid. No exacerbating factors. No vaginal bleeding. No contraction pain. Still with active fetal movement. Per pt has recently had self limited episode of diarrhea and nausea. Otherwise in current good state of health.   PNC at [facility] clinic. Declined genetic screen. Neg gtt. GBS post. Wants to breast feed. Husband to get vasectomy for contraception. No complications with this preg. Had elevated BP near the end of previous preg, was on medication 1 week postpartum. Currently normotensive throughout this preg.  Maternal Medical History:  Reason for admission: Nausea.     OB History   Grav Para Term Preterm Abortions TAB SAB Ect Mult Living   2 1 1       1      Obstetric Comments   2011 PP INCREASED BP; ON MED X 1 WEEK     1. Term, 39w0, nsvd 6lb6oz  Past Medical History  Diagnosis Date  . Pneumonia 2008  . Pregnancy induced hypertension     PP 2011  . Abnormal Pap smear   . Infection     UTI X 1   Past Surgical History  Procedure Laterality Date  . No past surgeries     Family History: family history includes Arthritis in her maternal aunt and maternal grandmother; Bipolar disorder in her mother; Cancer in her cousin, maternal aunt, maternal aunt, maternal grandfather, paternal grandfather, and paternal grandmother; Dementia in her paternal grandmother; Diabetes in her maternal aunt, maternal aunt, and mother; Early death in her maternal aunt; Heart disease in her maternal grandmother; Hyperlipidemia in her mother; Hypertension in her mother; Thyroid disease in her cousin. Social History:  reports that she has never smoked. She has never used smokeless tobacco. She reports that she drinks alcohol. She reports that she does not use illicit drugs.   Prenatal Transfer Tool  Maternal Diabetes: No Genetic Screening:  Declined Maternal Ultrasounds/Referrals: Normal Fetal Ultrasounds or other Referrals:  None Maternal Substance Abuse:  No Significant Maternal Medications:  None Significant Maternal Lab Results:  Lab values include: Group B Strep positive Other Comments:  None  Review of Systems  Constitutional: Negative for fever.  HENT: Negative for congestion and sore throat.   Eyes: Negative for blurred vision, pain and redness.  Respiratory: Negative for cough, shortness of breath and wheezing.   Cardiovascular: Negative for chest pain, palpitations and leg swelling.  Gastrointestinal: Positive for nausea, abdominal pain and diarrhea. Negative for heartburn, vomiting, constipation, blood in stool and melena.  Genitourinary: Negative for dysuria, urgency and frequency.  Musculoskeletal: Negative for myalgias.  Skin: Negative for rash.  Neurological: Negative for dizziness, seizures, loss of consciousness, weakness and headaches.  Endo/Heme/Allergies: Does not bruise/bleed easily.  Psychiatric/Behavioral: Negative for depression.    Dilation: 3 Effacement (%): Thick Station: -3 Exam by:: Elana Alm RNC Blood pressure 101/62, pulse 70, temperature 98.3 F (36.8 C), temperature source Oral, resp. rate 20, height 5\' 2"  (1.575 m), weight 92.987 kg (205 lb), last menstrual period 12/26/2012. Exam Physical Exam  Constitutional: She is oriented to person, place, and time. She appears well-developed and well-nourished. No distress.  HENT:  Head: Normocephalic and atraumatic.  Eyes: EOM are normal.  Neck: Normal range of motion. Neck supple.  Cardiovascular: Normal rate and regular rhythm.   No murmur heard. Respiratory: Effort normal and breath sounds normal. No  respiratory distress. She has no wheezes.  GI: Soft. Bowel sounds are normal. She exhibits distension (gravid). There is no tenderness.  Musculoskeletal: Normal range of motion. She exhibits no edema.  Neurological: She is alert and  oriented to person, place, and time. She has normal reflexes. She displays normal reflexes. She exhibits normal muscle tone.  Skin: Skin is warm and dry.  Psychiatric: She has a normal mood and affect. Her behavior is normal.     Prenatal labs: ABO, Rh: A/Positive/-- (10/29 1710) Antibody: Negative (03/17 0000) Rubella:   RPR: NON REAC (08/19 1129)  HBsAg: NEGATIVE (03/17 1131)  HIV: NON REACTIVE (08/19 1129)  GBS: Positive (10/07 0000)   FHR- baseline 130, mod var, post accel, no decel  Assessment/Plan: Ms Rothenberger is a 37 y.o G2P1001 who presents at 39w2 for PROM  1. PROM- not currently in active stage of labor. Per RN check earlier, cervix 3 and thick. Bedside US confirm cephalic presentation. No consistent contraction pattern, will augment with pit -routine orders -pit 2X2 -GBS post- tx with PCN -HIV NR -pain control with fentanyl and epidural when pt in active stages of labor -expect NSVD  2. FHR- reassuring -cat I tracing   3. Hx of GHTN- normotensive throughout this pregnancy, not requiring medication -will continue to monitor routine vitals -no signs of sx of pre-e at this time  Anselm Lis 09/27/2013, 6:03 PM   I have seen and examined this patient and agree with above documentation in the resident's note.   Rulon Abide, M.D. [facility] Fellow 09/27/2013 8:54 PM

## 2013-09-28 ENCOUNTER — Encounter (HOSPITAL_COMMUNITY): Payer: Self-pay | Admitting: *Deleted

## 2013-09-28 ENCOUNTER — Encounter (HOSPITAL_COMMUNITY)
Admission: AD | Disposition: A | Discharge status: Home / Self Care | Payer: Self-pay | Source: Ambulatory Visit | Attending: Obstetrics & Gynecology

## 2013-09-28 LAB — RPR: RPR Ser Ql: NONREACTIVE

## 2013-09-28 SURGERY — Surgical Case
Anesthesia: Epidural | Site: Abdomen | Wound class: Clean Contaminated

## 2013-09-28 MED ORDER — OXYTOCIN 10 UNIT/ML IJ SOLN
40.0000 [IU] | INTRAVENOUS | Status: DC | PRN
  Administered 2013-09-28: 40 [IU] via INTRAVENOUS

## 2013-09-28 MED ORDER — LIDOCAINE-EPINEPHRINE (PF) 2 %-1:200000 IJ SOLN
INTRAMUSCULAR | Status: AC
  Filled 2013-09-28: qty 20

## 2013-09-28 MED ORDER — LACTATED RINGERS IV SOLN
INTRAVENOUS | Status: DC | PRN
  Administered 2013-09-28 (×3): via INTRAVENOUS

## 2013-09-28 MED ORDER — KETOROLAC TROMETHAMINE 60 MG/2ML IM SOLN: 60.0000 mg | Freq: Once | INTRAMUSCULAR | Status: AC | PRN

## 2013-09-28 MED ORDER — BUPIVACAINE HCL (PF) 0.25 % IJ SOLN
INTRAMUSCULAR | Status: DC | PRN
  Administered 2013-09-28: 30 mL

## 2013-09-28 MED ORDER — LACTATED RINGERS IV SOLN
INTRAVENOUS | Status: DC | PRN
  Administered 2013-09-28 (×2): via INTRAVENOUS

## 2013-09-28 MED ORDER — 0.9 % SODIUM CHLORIDE (POUR BTL) OPTIME
TOPICAL | Status: DC | PRN
  Administered 2013-09-28: 1000 mL

## 2013-09-28 MED ORDER — KETOROLAC TROMETHAMINE 30 MG/ML IJ SOLN: 30.0000 mg | Freq: Four times a day (QID) | INTRAMUSCULAR | Status: AC | PRN

## 2013-09-28 MED ORDER — SODIUM BICARBONATE 8.4 % IV SOLN
INTRAVENOUS | Status: DC | PRN
  Administered 2013-09-28: 20 mL via EPIDURAL

## 2013-09-28 MED ORDER — CEFAZOLIN SODIUM-DEXTROSE 2-3 GM-% IV SOLR
INTRAVENOUS | Status: AC
  Filled 2013-09-28: qty 50

## 2013-09-28 MED ORDER — MORPHINE SULFATE (PF) 0.5 MG/ML IJ SOLN
INTRAMUSCULAR | Status: DC | PRN
  Administered 2013-09-28: 1 mg via EPIDURAL
  Administered 2013-09-28: 1 mg via INTRAVENOUS

## 2013-09-28 MED ORDER — MORPHINE SULFATE 0.5 MG/ML IJ SOLN
INTRAMUSCULAR | Status: AC
  Filled 2013-09-28: qty 10

## 2013-09-28 MED ORDER — MEPERIDINE HCL 25 MG/ML IJ SOLN: 6.2500 mg | INTRAMUSCULAR | Status: DC | PRN

## 2013-09-28 MED ORDER — KETOROLAC TROMETHAMINE 60 MG/2ML IM SOLN
INTRAMUSCULAR | Status: AC
  Filled 2013-09-28: qty 2

## 2013-09-28 MED ORDER — LACTATED RINGERS IV SOLN: INTRAVENOUS | Status: DC

## 2013-09-28 MED ORDER — PHENYLEPHRINE 40 MCG/ML (10ML) SYRINGE FOR IV PUSH (FOR BLOOD PRESSURE SUPPORT)
PREFILLED_SYRINGE | INTRAVENOUS | Status: AC
  Filled 2013-09-28: qty 5

## 2013-09-28 MED ORDER — SCOPOLAMINE 1 MG/3DAYS TD PT72
MEDICATED_PATCH | TRANSDERMAL | Status: AC
  Filled 2013-09-28: qty 1

## 2013-09-28 MED ORDER — FENTANYL CITRATE 0.05 MG/ML IJ SOLN
INTRAMUSCULAR | Status: AC
  Filled 2013-09-28: qty 2

## 2013-09-28 MED ORDER — CHLOROPROCAINE HCL 3 % IJ SOLN
INTRAMUSCULAR | Status: AC
  Filled 2013-09-28: qty 20

## 2013-09-28 MED ORDER — BUPIVACAINE HCL (PF) 0.25 % IJ SOLN
INTRAMUSCULAR | Status: AC
  Filled 2013-09-28: qty 30

## 2013-09-28 MED ORDER — HYDROMORPHONE HCL PF 1 MG/ML IJ SOLN
0.2500 mg | INTRAMUSCULAR | Status: DC | PRN
  Administered 2013-09-28 – 2013-09-29 (×4): 0.5 mg via INTRAVENOUS

## 2013-09-28 MED ORDER — DEXTROSE 5 % IV SOLN
INTRAVENOUS | Status: AC
  Filled 2013-09-28: qty 3000

## 2013-09-28 MED ORDER — MORPHINE SULFATE (PF) 0.5 MG/ML IJ SOLN
INTRAMUSCULAR | Status: DC | PRN
  Administered 2013-09-28: 3 mg via EPIDURAL

## 2013-09-28 MED ORDER — HYDROMORPHONE HCL PF 1 MG/ML IJ SOLN
INTRAMUSCULAR | Status: AC
  Administered 2013-09-29: 0.5 mg via INTRAVENOUS
  Filled 2013-09-28: qty 1

## 2013-09-28 MED ORDER — CEFAZOLIN SODIUM-DEXTROSE 2-3 GM-% IV SOLR
2.0000 g | INTRAVENOUS | Status: AC
  Administered 2013-09-28: 2 g via INTRAVENOUS
  Filled 2013-09-28: qty 50

## 2013-09-28 MED ORDER — ONDANSETRON HCL 4 MG/2ML IJ SOLN
INTRAMUSCULAR | Status: DC | PRN
  Administered 2013-09-28: 4 mg via INTRAVENOUS

## 2013-09-28 MED ORDER — TERBUTALINE SULFATE 1 MG/ML IJ SOLN
INTRAMUSCULAR | Status: AC
  Filled 2013-09-28: qty 1

## 2013-09-28 MED ORDER — MISOPROSTOL 200 MCG PO TABS
ORAL_TABLET | ORAL | Status: AC
  Filled 2013-09-28: qty 5

## 2013-09-28 MED ORDER — SCOPOLAMINE 1 MG/3DAYS TD PT72
1.0000 | MEDICATED_PATCH | Freq: Once | TRANSDERMAL | Status: DC
  Administered 2013-09-28: 1.5 mg via TRANSDERMAL

## 2013-09-28 MED ORDER — EPHEDRINE 5 MG/ML INJ
INTRAVENOUS | Status: AC
  Filled 2013-09-28: qty 10

## 2013-09-28 MED ORDER — FENTANYL CITRATE 0.05 MG/ML IJ SOLN
INTRAMUSCULAR | Status: DC | PRN
  Administered 2013-09-28 (×2): 50 ug via INTRAVENOUS

## 2013-09-28 MED ORDER — PHENYLEPHRINE HCL 10 MG/ML IJ SOLN
INTRAMUSCULAR | Status: DC | PRN
  Administered 2013-09-28 (×2): 40 ug via INTRAVENOUS

## 2013-09-28 SURGICAL SUPPLY — 36 items
BENZOIN TINCTURE PRP APPL 2/3 (GAUZE/BANDAGES/DRESSINGS) ×2 IMPLANT
CLAMP CORD UMBIL (MISCELLANEOUS) ×2 IMPLANT
CLOTH BEACON ORANGE TIMEOUT ST (SAFETY) ×2 IMPLANT
DRAPE LG THREE QUARTER DISP (DRAPES) ×4 IMPLANT
DRSG OPSITE POSTOP 4X10 (GAUZE/BANDAGES/DRESSINGS) ×2 IMPLANT
DURAPREP 26ML APPLICATOR (WOUND CARE) ×2 IMPLANT
ELECT REM PT RETURN 9FT ADLT (ELECTROSURGICAL) ×2
ELECTRODE REM PT RTRN 9FT ADLT (ELECTROSURGICAL) ×1 IMPLANT
EXTRACTOR VACUUM M CUP 4 TUBE (SUCTIONS) IMPLANT
GLOVE BIOGEL PI IND STRL 7.0 (GLOVE) ×1 IMPLANT
GLOVE BIOGEL PI INDICATOR 7.0 (GLOVE) ×1
GLOVE ECLIPSE 7.0 STRL STRAW (GLOVE) ×4 IMPLANT
GOWN PREVENTION PLUS XLARGE (GOWN DISPOSABLE) ×2 IMPLANT
GOWN STRL REIN XL XLG (GOWN DISPOSABLE) ×2 IMPLANT
HEMOSTAT SURGICEL 4X8 (HEMOSTASIS) ×2 IMPLANT
KIT ABG SYR 3ML LUER SLIP (SYRINGE) IMPLANT
NEEDLE HYPO 22GX1.5 SAFETY (NEEDLE) ×2 IMPLANT
NEEDLE HYPO 25X5/8 SAFETYGLIDE (NEEDLE) IMPLANT
NS IRRIG 1000ML POUR BTL (IV SOLUTION) ×2 IMPLANT
PACK C SECTION WH (CUSTOM PROCEDURE TRAY) ×2 IMPLANT
PAD ABD 7.5X8 STRL (GAUZE/BANDAGES/DRESSINGS) ×2 IMPLANT
PAD OB MATERNITY 4.3X12.25 (PERSONAL CARE ITEMS) ×2 IMPLANT
RTRCTR C-SECT PINK 25CM LRG (MISCELLANEOUS) ×2 IMPLANT
SPONGE GAUZE 4X4 12PLY (GAUZE/BANDAGES/DRESSINGS) ×2 IMPLANT
STAPLER VISISTAT 35W (STAPLE) IMPLANT
STRIP CLOSURE SKIN 1/2X4 (GAUZE/BANDAGES/DRESSINGS) ×2 IMPLANT
SUT PLAIN 2 0 XLH (SUTURE) ×2 IMPLANT
SUT VIC AB 0 CTX 36 (SUTURE) ×3
SUT VIC AB 0 CTX36XBRD ANBCTRL (SUTURE) ×3 IMPLANT
SUT VIC AB 2-0 SH 27 (SUTURE) ×1
SUT VIC AB 2-0 SH 27XBRD (SUTURE) ×1 IMPLANT
SUT VIC AB 4-0 KS 27 (SUTURE) ×2 IMPLANT
SYR 30ML LL (SYRINGE) ×2 IMPLANT
TOWEL OR 17X24 6PK STRL BLUE (TOWEL DISPOSABLE) ×2 IMPLANT
TRAY FOLEY CATH 14FR (SET/KITS/TRAYS/PACK) ×2 IMPLANT
WATER STERILE IRR 1000ML POUR (IV SOLUTION) ×2 IMPLANT

## 2013-09-28 NOTE — Progress Notes (Addendum)
Carla Sherman is a 37 y.o. G2P1001 at [redacted]w[redacted]d admitted for PROM  Subjective: Called by nursing for continued decelerations after contractions but with good variability. Pt doing well and having good FM.   Objective: BP 115/68  Pulse 72  Temp(Src) 98.9 F (37.2 C) (Oral)  Resp 16  Ht 5\' 2"  (1.575 m)  Wt 92.987 kg (205 lb)  BMI 37.49 kg/m2  LMP 12/26/2012 I/O last 3 completed shifts: In: -  Out: 1350 [Urine:1350] Total I/O In: 240 [P.O.:240] Out: 1400 [Urine:900; Emesis/NG output:500]  FHT:  FHR: 155 bpm, variability: mod but periods of minimal,  accelerations:  Present,  decelerations:  Present some deep variables after contractions and some occasional decels UC:   MVU adequate SVE:   Dilation: 6.5 Effacement (%): 80 Station: -2 Exam by:: Dr. Reola Calkins  Labs: Lab Results  Component Value Date   WBC 7.3 09/27/2013   HGB 11.2* 09/27/2013   HCT 32.9* 09/27/2013   MCV 83.3 09/27/2013   PLT 254 09/27/2013    Assessment / Plan: induction of labor with hx of PROM. now with adequate contractions but fetal intolearance to current dose of pitocin.   Labor: stop pit for 1 hour. amnioinfusion started. restart pit in 1 hour at 81mu/min. SVE by nurse check of 8cm Fetal Wellbeing:  Category II Pain Control:  Epidural I/D:  positive and on pcn Anticipated MOD:  NSVD  Johanthan Kneeland L 09/28/2013, 12:55 PM

## 2013-09-28 NOTE — Progress Notes (Signed)
  Carla Sherman is a 37 y.o. G2P1001 at [redacted]w[redacted]d by LMP and 6 wk U/S  Subjective: Pt is comfortable with epidural.  Objective: BP 108/64  Pulse 81  Temp(Src) 98.3 F (36.8 C) (Oral)  Resp 20  Ht 5\' 2"  (1.575 m)  Wt 92.987 kg (205 lb)  BMI 37.49 kg/m2  LMP 12/26/2012 I/O last 3 completed shifts: In: -  Out: 1350 [Urine:1350] Total I/O In: -  Out: 500 [Urine:500]  FHT:  FHR: 145 bpm, variability: moderate,  accelerations:  Present,  decelerations:  Absent UC:   irregular, every 2-5 minutes SVE:   Dilation: 6 Effacement (%): 60 Station: -2 Exam by:: Ricci Barker  Dr. Emelda Fear in the room performing bedside U/S. Confirmed vertex, OP presentation.  Labs: Lab Results  Component Value Date   WBC 7.3 09/27/2013   HGB 11.2* 09/27/2013   HCT 32.9* 09/27/2013   MCV 83.3 09/27/2013   PLT 254 09/27/2013    Assessment / Plan: Induction of labor in progress PROM x 13 hours  Continue to monitor   Selena Lesser 09/28/2013, 7:24 AM  I have seen and examined this patient and I agree with the above. Will hold on IUPC for now per Dr Emelda Fear. Cam Hai 7:53 AM 09/28/2013

## 2013-09-28 NOTE — Progress Notes (Signed)
500 LR bolus

## 2013-09-28 NOTE — Progress Notes (Signed)
Carla Sherman is a 37 y.o. G2P1001 at [redacted]w[redacted]d admitted for PROM  Subjective:  Pt slowly progressing and now finally in more of a normal contraction pattern. Comfortable. Still having some vb but mostly old.   Objective: BP 89/53  Pulse 77  Temp(Src) 98.8 F (37.1 C) (Oral)  Resp 18  Ht 5\' 2"  (1.575 m)  Wt 92.987 kg (205 lb)  BMI 37.49 kg/m2  LMP 12/26/2012 I/O last 3 completed shifts: In: -  Out: 1350 [Urine:1350] Total I/O In: 240 [P.O.:240] Out: 1000 [Urine:500; Emesis/NG output:500]  FHT:  FHR: 150 bpm, variability: moderate,  accelerations:  Present,  decelerations:  Present variables, some deep with increase in pit to 20, now decreased and improved UC:   regular, every 2 minutes SVE:   Dilation: 6 Effacement (%): 80 Station: -2 Exam by:: L. MCDANIEL  Labs: Lab Results  Component Value Date   WBC 7.3 09/27/2013   HGB 11.2* 09/27/2013   HCT 32.9* 09/27/2013   MCV 83.3 09/27/2013   PLT 254 09/27/2013    Assessment / Plan: augmentation of labor, slowly progressing.    Labor: forebag ruptured. IUPC placed anteriorly avoiding low lying posterior placenta Fetal Wellbeing:  Category II Pain Control:  Epidural I/D:  pos on PCN Anticipated MOD:  NSVD  Hamdi Vari L 09/28/2013, 11:05 AM

## 2013-09-28 NOTE — Op Note (Addendum)
Preoperative Diagnosis:  IUP @ [redacted]w[redacted]d, arrest of descent  Postoperative Diagnosis:  Same  Procedure: Primary low transverse cesarean section  Surgeon: Tinnie Gens, M.D.  Assistant: Rulon Abide, MD  Findings: Viable female infant, APGAR (1 MIN):  8 APGAR (5 MINS):  9  vertex presentation, direct OP position  Estimated blood loss: 1200 cc  Complications: left lateral uterine tear extending into the broad ligament and posterior uterine perforation.   Specimens: Placenta to labor and delivery  Reason for procedure: Briefly, the patient is a 37 y.o. G2P1001 [redacted]w[redacted]d who presents for PROM and after induction of labor progressed to complete. Pt pushed for 2.5 hours with baby in OP position and no descent. After informed consent was obtained, a c-section was called for arrest of descent.   Procedure: Patient is a to the OR where epidural was dosed to spinal level. She was then placed in a supine position with left lateral tilt. She received 2 g of Ancef and SCDs were in place. A timeout was performed. She was prepped and draped in the usual sterile fashion. A Foley catheter was placed in the bladder. A knife was then used to make a Pfannenstiel incision. This incision was carried out to underlying fascia which was divided in the midline with the knife. The incision was extended laterally, sharply.  The rectus was divided in the midline.  The peritoneal cavity was entered bluntly.  Alexis retractor was placed inside the incision.  A knife was used to make a low transverse incision on the uterus. This incision was carried down to the amniotic cavity was entered. Fetus was in cephalic position and was brought up out of the incision without difficulty. Spontaneous cry. Cord was clamped x 2 and cut. Infant taken to waiting NICU team.  Cord blood was obtained. Placenta was delivered from the uterus.  Uterus was cleaned with dry lap pads. Uterine incision closed with 0 Vicryl suture in a locked running fashion. A  second layer of 0 Vicryl in an imbricating fashion was used to achieve hemostasis. Alexis retractor was removed from the abdomen. Peritoneal closure was done with 0 Vicryl suture.  It was noted she was persistently bleeding from what appeared to be a posterior uterine wall defect on the left lateral edge as well as anteriorly on the left lateral edge extending into the broad ligament.  2-0 Vicryl was used to close the posterior defect and pressure was held and then surgicel applied with adequate hemostasis achieved.  The anterior defect was closed with 2-0 Vicryl but with significant difficulty achieving hemostasis. Care was taken to attempt to avoid the left ureter and hemostasis was eventually achieved with ligation and surgicel. Peritoneum closed with 2-0 Vicryl in a purse string fashion. Fascia was closed with 0 Vicryl suture in a running fashion. Subcutaneous tissue infused with 30cc 0.25% Marcaine.  Subcutaneous closure was performed with 0 plain suture.  Skin closed using 3-0 Vicryl on a Keith needle.  Steri strips applied, followed by pressure dressing.  All instrument, needle and lap counts were correct x 2.  Urine was blood tinged at the start of the case and remained persistently blood tinged at the end.  Patient was awake and taken to PACU stable.  Infant placed skin to skin with dad and stable.   Rulon Abide LMD 09/28/2013 10:45 PM

## 2013-09-28 NOTE — Progress Notes (Signed)
Faculty Practice OB/GYN Attending Note  Called to evaluate patient with bradycardia.  FSE was being placed on my arrival.  FHR returned to baseline for 150s with moderate variability.  On exam, 10/100/+1.  Patient offered continued expectant management vs vacuum assisted vaginal delivery vs cesarean sections. Risks reviewed. She opts to keep pushing for now but understands that if she has a prolonged deceleration or brady cardia again, cesarean section will be indicated.  Continue close observation, hopeful for vaginal delivery.  Patient's care was signed off to Dr. Tinnie Gens, oncoming Attending on call.  Jaynie Collins, MD, FACOG Attending Obstetrician & Gynecologist Faculty Practice, [facility]

## 2013-09-28 NOTE — Progress Notes (Signed)
Dr. Reola Calkins at bedside viewing FHR tracing. Will decrease Pitocin to 16. Informed of interventions, FHR tracing, MVUs, AMT of vaginal bleeding, and SVE.

## 2013-09-28 NOTE — Progress Notes (Signed)
Patient ID: Carla Sherman, female   DOB: 1976/09/19, 37 y.o.   MRN: [id number] LABOR PROGRESS NOTE  LASHAWNE Sherman is a 37 y.o. G2P1001 at [redacted]w[redacted]d  admitted for PROM  Subjective:  Pt pushing now for 2.5 hours and getting tired. Very little movement of fetal head.   Objective: BP 113/69  Pulse 87  Temp(Src) 99.4 F (37.4 C) (Axillary)  Resp 18  Ht 5\' 2"  (1.575 m)  Wt 92.987 kg (205 lb)  BMI 37.49 kg/m2  LMP 12/26/2012    FHT:  FHR: 160 bpm, variability: minimal ,  accelerations:  Abscent,  decelerations:  Present continued deep variables UC:   irregular, every 4-6 minutes SVE:   Dilation: 10 Effacement (%): 100 Station: +1 Exam by:: Anyanwu Pitocin @ 2 mu/min  Labs: Lab Results  Component Value Date   WBC 7.3 09/27/2013   HGB 11.2* 09/27/2013   HCT 32.9* 09/27/2013   MCV 83.3 09/27/2013   PLT 254 09/27/2013    Assessment / Plan: Arrest of decent  The risks of cesarean section discussed with the patient included but were not limited to: bleeding which may require transfusion or reoperation; infection which may require antibiotics; injury to bowel, bladder, ureters or other surrounding organs; injury to the fetus; need for additional procedures including hysterectomy in the event of a life-threatening hemorrhage; placental abnormalities wth subsequent pregnancies, incisional problems, thromboembolic phenomenon and other postoperative/anesthesia complications. The patient concurred with the proposed plan, giving informed written consent for the procedure.   Patient has been NPO since noon with only sips of water she will remain NPO for procedure. Anesthesia and OR aware. Preoperative prophylactic antibiotics and SCDs ordered on call to the OR.  To OR when ready.   Vale Haven, MD 09/28/2013, 8:44 PM

## 2013-09-28 NOTE — Transfer of Care (Signed)
Immediate Anesthesia Transfer of Care Note  Patient: Carla Sherman  Procedure(s) Performed: Procedure(s): Primary Cesarean Section Delivery Baby Girl @ 2154, Apgars 8/9 ([name])  Patient Location: PACU  Anesthesia Type:Epidural  Level of Consciousness: awake, alert , oriented and patient cooperative  Airway & Oxygen Therapy: Patient Spontanous Breathing  Post-op Assessment: Report given to PACU RN and Post -op Vital signs reviewed and stable  Post vital signs: Reviewed and stable  Complications: No apparent anesthesia complications

## 2013-09-28 NOTE — Consult Note (Signed)
Neonatology Note:  Attendance at C-section:  I was asked by Dr. Pratt to attend this primary C/S at term due to failure of descent. The mother is a G2P1 A pos, GBS pos who received Pen G during labor and remained afebrile. ROM 30 hours prior to delivery, fluid clear. Infant vigorous with good spontaneous cry and tone. Needed only minimal bulb suctioning. Ap 8/9. Lungs clear to ausc in DR. Head quite misshapen with mild bogginess of the scalp posteriorly. To CN to care of Pediatrician.  Carla Klecka C. Winter Trefz, MD  

## 2013-09-28 NOTE — Progress Notes (Signed)
I have seen and examined this patient and I agree with the above. Kili Gracy 1:35 AM 09/28/2013    

## 2013-09-28 NOTE — OR Nursing (Signed)
Surgicel place on uterus by Dr. Shawnie Pons @ 2220  Lot # [id number] Exp 2019-01.  Approximately 60ml's expressed on Uterine massage.   Uterus firm at Umbilicus.

## 2013-09-28 NOTE — Progress Notes (Signed)
Dr Shawnie Pons notified of length of time pushing, SVe, amt of pit, FHr tracing, Ucs and pushing progress.

## 2013-09-28 NOTE — Progress Notes (Signed)
JANESIA JOSWICK is a 37 y.o. G2P1001 at [redacted]w[redacted]d admitted for PROM  Subjective: Pt continuing to have variables and decles with contractions. Periods of good variability in between. +FM.   Objective: BP 101/60  Pulse 89  Temp(Src) 98.6 F (37 C) (Oral)  Resp 18  Ht 5\' 2"  (1.575 m)  Wt 92.987 kg (205 lb)  BMI 37.49 kg/m2  LMP 12/26/2012 I/O last 3 completed shifts: In: -  Out: 1350 [Urine:1350] Total I/O In: 240 [P.O.:240] Out: 1400 [Urine:900; Emesis/NG output:500]  FHT:  FHR: 150 bpm, variability: min to moderate,  accelerations:  Present,  decelerations:  Present variables and decels UC:   Regular on pitocin and adequate, then spaced out wth MVUs 150-160 when off pit.  SVE:   Dilation: Lip/rim Effacement (%): 90 Station: -2 Exam by::  (Dr Reola Calkins, Manual rotation of head performed at bedside)  Labs: Lab Results  Component Value Date   WBC 7.3 09/27/2013   HGB 11.2* 09/27/2013   HCT 32.9* 09/27/2013   MCV 83.3 09/27/2013   PLT 254 09/27/2013    Assessment / Plan: induction of labor for PROM. fetal distress when on pitocin but continued good variability  Labor: manually rotated baby from OP to LOA. pt with lip of cervix but unable to push past. will stop pitocin and see what she does alone and if needed, restart pit at 25mu/min Fetal Wellbeing:  Category II Pain Control:  Epidural I/D:  pos- on PCN Low lying placenta- continued mild bleeding on occasion but more consistent with bloody show.  Anticipated MOD:  NSVD  Chancey Cullinane L 09/28/2013, 2:56 PM

## 2013-09-29 ENCOUNTER — Encounter (HOSPITAL_COMMUNITY): Payer: Self-pay

## 2013-09-29 ENCOUNTER — Inpatient Hospital Stay (HOSPITAL_COMMUNITY): Payer: BC Managed Care – PPO

## 2013-09-29 LAB — BASIC METABOLIC PANEL
BUN: 8 mg/dL (ref 6–23)
Calcium: 8.5 mg/dL (ref 8.4–10.5)
Creatinine, Ser: 0.91 mg/dL (ref 0.50–1.10)
GFR calc Af Amer: >90 mL/min (ref >90)
GFR calc non Af Amer: 80 mL/min — ABNORMAL LOW (ref >90)

## 2013-09-29 LAB — CBC
HCT: 26.2 % — ABNORMAL LOW (ref 36.0–46.0)
MCHC: 34.4 g/dL (ref 30.0–36.0)
MCV: 83.7 fL (ref 78.0–100.0)
RDW: 13.7 % (ref 11.5–15.5)

## 2013-09-29 MED ORDER — WITCH HAZEL-GLYCERIN EX PADS: 1.0000 "application " | MEDICATED_PAD | CUTANEOUS | Status: DC | PRN

## 2013-09-29 MED ORDER — NALBUPHINE SYRINGE 5 MG/0.5 ML
5.0000 mg | INJECTION | INTRAMUSCULAR | Status: DC | PRN
  Filled 2013-09-29: qty 1

## 2013-09-29 MED ORDER — METOCLOPRAMIDE HCL 5 MG/ML IJ SOLN: 10.0000 mg | Freq: Three times a day (TID) | INTRAMUSCULAR | Status: DC | PRN

## 2013-09-29 MED ORDER — DIPHENHYDRAMINE HCL 50 MG/ML IJ SOLN: 25.0000 mg | INTRAMUSCULAR | Status: DC | PRN

## 2013-09-29 MED ORDER — FERROUS SULFATE 325 (65 FE) MG PO TABS
325.0000 mg | ORAL_TABLET | Freq: Every day | ORAL | Status: DC
  Administered 2013-09-30 – 2013-10-01 (×2): 325 mg via ORAL
  Filled 2013-09-29 (×2): qty 1

## 2013-09-29 MED ORDER — SIMETHICONE 80 MG PO CHEW: 80.0000 mg | CHEWABLE_TABLET | ORAL | Status: DC | PRN

## 2013-09-29 MED ORDER — TETANUS-DIPHTH-ACELL PERTUSSIS 5-2.5-18.5 LF-MCG/0.5 IM SUSP: 0.5000 mL | Freq: Once | INTRAMUSCULAR | Status: DC

## 2013-09-29 MED ORDER — IBUPROFEN 600 MG PO TABS
600.0000 mg | ORAL_TABLET | Freq: Four times a day (QID) | ORAL | Status: DC
  Administered 2013-09-29 – 2013-10-01 (×9): 600 mg via ORAL
  Filled 2013-09-29 (×9): qty 1

## 2013-09-29 MED ORDER — SIMETHICONE 80 MG PO CHEW
80.0000 mg | CHEWABLE_TABLET | ORAL | Status: DC
  Administered 2013-09-30: 80 mg via ORAL
  Filled 2013-09-29: qty 1

## 2013-09-29 MED ORDER — PRENATAL MULTIVITAMIN CH
1.0000 | ORAL_TABLET | Freq: Every day | ORAL | Status: DC
  Administered 2013-09-29 – 2013-10-01 (×3): 1 via ORAL
  Filled 2013-09-29 (×3): qty 1

## 2013-09-29 MED ORDER — MEASLES, MUMPS & RUBELLA VAC ~~LOC~~ INJ
0.5000 mL | INJECTION | Freq: Once | SUBCUTANEOUS | Status: DC
  Filled 2013-09-29: qty 0.5

## 2013-09-29 MED ORDER — DIPHENHYDRAMINE HCL 25 MG PO CAPS: 25.0000 mg | ORAL_CAPSULE | Freq: Four times a day (QID) | ORAL | Status: DC | PRN

## 2013-09-29 MED ORDER — ONDANSETRON HCL 4 MG PO TABS: 4.0000 mg | ORAL_TABLET | ORAL | Status: DC | PRN

## 2013-09-29 MED ORDER — IOHEXOL 300 MG/ML  SOLN
100.0000 mL | Freq: Once | INTRAMUSCULAR | Status: AC | PRN
  Administered 2013-09-29: 100 mL via INTRAVENOUS

## 2013-09-29 MED ORDER — DIPHENHYDRAMINE HCL 50 MG/ML IJ SOLN: 12.5000 mg | INTRAMUSCULAR | Status: DC | PRN

## 2013-09-29 MED ORDER — LACTATED RINGERS IV SOLN
INTRAVENOUS | Status: DC
Start: 2013-09-29 — End: 2013-10-01
  Administered 2013-09-29: 09:00:00 via INTRAVENOUS

## 2013-09-29 MED ORDER — SIMETHICONE 80 MG PO CHEW
80.0000 mg | CHEWABLE_TABLET | Freq: Three times a day (TID) | ORAL | Status: DC
  Administered 2013-09-29 – 2013-10-01 (×4): 80 mg via ORAL
  Filled 2013-09-29 (×6): qty 1

## 2013-09-29 MED ORDER — ZOLPIDEM TARTRATE 5 MG PO TABS: 5.0000 mg | ORAL_TABLET | Freq: Every evening | ORAL | Status: DC | PRN

## 2013-09-29 MED ORDER — MAGNESIUM HYDROXIDE 400 MG/5ML PO SUSP: 30.0000 mL | ORAL | Status: DC | PRN

## 2013-09-29 MED ORDER — OXYTOCIN 40 UNITS IN LACTATED RINGERS INFUSION - SIMPLE MED: 62.5000 mL/h | INTRAVENOUS | Status: AC

## 2013-09-29 MED ORDER — DIPHENHYDRAMINE HCL 25 MG PO CAPS: 25.0000 mg | ORAL_CAPSULE | ORAL | Status: DC | PRN

## 2013-09-29 MED ORDER — SODIUM CHLORIDE 0.9 % IJ SOLN: 3.0000 mL | INTRAMUSCULAR | Status: DC | PRN

## 2013-09-29 MED ORDER — LANOLIN HYDROUS EX OINT: 1.0000 "application " | TOPICAL_OINTMENT | CUTANEOUS | Status: DC | PRN

## 2013-09-29 MED ORDER — MENTHOL 3 MG MT LOZG: 1.0000 | LOZENGE | OROMUCOSAL | Status: DC | PRN

## 2013-09-29 MED ORDER — NALOXONE HCL 1 MG/ML IJ SOLN
1.0000 ug/kg/h | INTRAMUSCULAR | Status: DC | PRN
  Filled 2013-09-29: qty 2

## 2013-09-29 MED ORDER — HYDROMORPHONE HCL PF 1 MG/ML IJ SOLN
INTRAMUSCULAR | Status: AC
  Administered 2013-09-29: 0.5 mg via INTRAVENOUS
  Filled 2013-09-29: qty 1

## 2013-09-29 MED ORDER — NALOXONE HCL 0.4 MG/ML IJ SOLN: 0.4000 mg | INTRAMUSCULAR | Status: DC | PRN

## 2013-09-29 MED ORDER — ONDANSETRON HCL 4 MG/2ML IJ SOLN: 4.0000 mg | Freq: Three times a day (TID) | INTRAMUSCULAR | Status: DC | PRN

## 2013-09-29 MED ORDER — OXYCODONE-ACETAMINOPHEN 5-325 MG PO TABS
1.0000 | ORAL_TABLET | ORAL | Status: DC | PRN
  Administered 2013-09-29 (×2): 1 via ORAL
  Administered 2013-09-30 – 2013-10-01 (×4): 2 via ORAL
  Filled 2013-09-29: qty 2
  Filled 2013-09-29 (×2): qty 1
  Filled 2013-09-29 (×3): qty 2

## 2013-09-29 MED ORDER — SENNOSIDES-DOCUSATE SODIUM 8.6-50 MG PO TABS
2.0000 | ORAL_TABLET | ORAL | Status: DC
  Administered 2013-09-29 – 2013-09-30 (×2): 2 via ORAL
  Filled 2013-09-29 (×2): qty 2

## 2013-09-29 MED ORDER — ONDANSETRON HCL 4 MG/2ML IJ SOLN: 4.0000 mg | INTRAMUSCULAR | Status: DC | PRN

## 2013-09-29 MED ORDER — DIBUCAINE 1 % RE OINT: 1.0000 "application " | TOPICAL_OINTMENT | RECTAL | Status: DC | PRN

## 2013-09-29 NOTE — Progress Notes (Signed)
Subjective: Postpartum Day 1: Cesarean Delivery Patient reports some mild pain in abdomen but overall comfortable. Foley still in place. Pt hungry this AM but has not eaten. Has not been up moving around.   Objective: Vital signs in last 24 hours: Temp:  [97.8 F (36.6 C)-99.5 F (37.5 C)] 98.4 F (36.9 C) (10/31 0450) Pulse Rate:  [56-113] 75 (10/31 0450) Resp:  [12-18] 16 (10/31 0450) BP: (82-126)/(46-95) 126/84 mmHg (10/31 0450) SpO2:  [97 %-99 %] 97 % (10/31 0450)  Physical Exam:  General: alert, cooperative and no distress Lochia: appropriate Uterine Fundus: firm Incision: pressure dressing in place and dry.   DVT Evaluation: No cords or calf tenderness. No significant calf/ankle edema.   Recent Labs  09/27/13 1710 09/29/13 0550  HGB 11.2* 9.0*  HCT 32.9* 26.2*    Assessment/Plan: Status post Cesarean section with complication of lateral uterine extension into the broad ligament and a perforation through the posterior wall.  Doing well postoperatively.  Start iron for hgb <10 - urine clear this am and improved from prior to surgery - planning of vasectomy for birth control - breast feeding.   Continue current care.  Kohner Orlick L 09/29/2013, 7:43 AM

## 2013-09-29 NOTE — Anesthesia Postprocedure Evaluation (Signed)
  Anesthesia Post-op Note  Patient: Carla Sherman  Procedure(s) Performed: Procedure(s): Primary Cesarean Section Delivery Baby Girl @ 2154, Apgars 8/9 ([name])  Patient is awake, responsive, moving her legs, and has signs of resolution of her numbness. Pain and nausea are reasonably well controlled. Vital signs are stable and clinically acceptable. Oxygen saturation is clinically acceptable. There are no apparent anesthetic complications at this time. Patient is ready for discharge.

## 2013-09-29 NOTE — Anesthesia Postprocedure Evaluation (Signed)
  Anesthesia Post-op Note  Patient: Carla Sherman  Procedure(s) Performed: Procedure(s): Primary Cesarean Section Delivery Baby Girl @ 2154, Apgars 8/9 ([name])  Patient Location: Mother/Baby  Anesthesia Type:Epidural  Level of Consciousness: awake  Airway and Oxygen Therapy: Patient Spontanous Breathing  Post-op Pain: none  Post-op Assessment: Patient's Cardiovascular Status Stable, Respiratory Function Stable, Patent Airway, No signs of Nausea or vomiting, Adequate PO intake, Pain level controlled, No headache, No backache, No residual numbness and No residual motor weakness  Post-op Vital Signs: Reviewed and stable  Complications: No apparent anesthesia complications

## 2013-09-30 LAB — CBC
HCT: 23.3 % — ABNORMAL LOW (ref 36.0–46.0)
MCHC: 34.8 g/dL (ref 30.0–36.0)
Platelets: 203 10*3/uL (ref 150–400)
RBC: 2.79 MIL/uL — ABNORMAL LOW (ref 3.87–5.11)
RDW: 14 % (ref 11.5–15.5)
WBC: 13.8 10*3/uL — ABNORMAL HIGH (ref 4.0–10.5)

## 2013-09-30 MED ORDER — OXYCODONE-ACETAMINOPHEN 5-325 MG PO TABS: 1.0000 | ORAL_TABLET | ORAL | Status: DC | PRN

## 2013-09-30 MED ORDER — IBUPROFEN 600 MG PO TABS: 600.0000 mg | ORAL_TABLET | Freq: Four times a day (QID) | ORAL | Status: DC

## 2013-09-30 MED ORDER — FERROUS SULFATE 325 (65 FE) MG PO TABS: 325.0000 mg | ORAL_TABLET | Freq: Every day | ORAL | Status: DC

## 2013-09-30 NOTE — Discharge Summary (Signed)
Obstetric Discharge Summary Reason for Admission: rupture of membranes- PROM @ 4p on 10/29 Prenatal Procedures: none Intrapartum Procedures: cesarean: low cervical, transverse and GBS prophylaxis- C/S due to arrest of descent after pushing x 2.5 hours Postpartum Procedures: none Complications-Operative and Postpartum: left lateral uterine tear extending into the broad ligament and posterior uterine perforation Hemoglobin  Date Value Range Status  09/30/2013 8.1* 12.0 - 15.0 g/dL Final  1/61/0960 45.4  12.2 - 16.2 g/dL Final     HCT  Date Value Range Status  09/30/2013 23.3* 36.0 - 46.0 % Final     HCT, POC  Date Value Range Status  07/18/2012 42.5  37.7 - 47.9 % Final   BMET    Component Value Date/Time   NA 133* 09/29/2013 1125   K 3.7 09/29/2013 1125   CL 100 09/29/2013 1125   CO2 26 09/29/2013 1125   GLUCOSE 106* 09/29/2013 1125   BUN 8 09/29/2013 1125   CREATININE 0.91 09/29/2013 1125   CREATININE 0.76 07/18/2012 2054   CALCIUM 8.5 09/29/2013 1125   GFRNONAA 80* 09/29/2013 1125   GFRAA >90 09/29/2013 1125    Carla Sherman is a 37yo G2P1001 at 39.3wks who was admitted in the early evening on 10/29 with PROM that afternoon. Her preg has been followed by the [facility] and it has been essentially unremarkable other than at 34wks the placenta was noted to be low-lying 1.4cm from the os. Shortly after admission she was started on Pitocin in order to achieve active labor. The labor progress was protracted and she also had an episode of mod vag bldg which stabilized.  By the early evening of 10/30 she became completely dilated and pushed x 2.5 hours with minimal descent of the vtx. C/S was offered to the pt and she agreed to proceed. (See Op Note for details on that procedure.) On POD#1 the pt was doing well but it was decided that she would have a CT to evaluate the extension of her uterine tear- the scan was negative. She was noted to have asymptomatic anemia and iron was started. On POD#2 she  is deemed to have received the full benefit of her hospital stay and she will be discharged home. She is breastfeeding and her husband will have a vasectomy.  Physical Exam:  General: alert, cooperative and mild distress Heart: RRR Lungs: nl effort Lochia: appropriate Uterine Fundus: firm Incision: pressure dsg CDI DVT Evaluation: No evidence of DVT seen on physical exam.  Discharge Diagnoses: Term Pregnancy-delivered; PostOp Anemia  Discharge Information: Date: 09/30/2013 Activity: pelvic rest Diet: routine Medications: PNV, Ibuprofen, Iron and Percocet Condition: stable Instructions: refer to practice specific booklet Discharge to: home Follow-up Information   Follow up with [facility]. (Make an appointment for a wound check in 2 weeks, and then a postpartum appointment in 6 weeks.)    Contact information:   [address] [place] [address] [phone]      Newborn Data: Live born female  Birth Weight: 6 lb 10.7 oz (3025 g) APGAR: 8, 9  Home with mother.  Carla Sherman 09/30/2013, 7:30 AM

## 2013-10-01 ENCOUNTER — Ambulatory Visit: Payer: Self-pay

## 2013-10-01 NOTE — Discharge Summary (Signed)
Obstetric Discharge Summary  Reason for Admission: rupture of membranes- PROM @ 4p on 10/29  Prenatal Procedures: none  Intrapartum Procedures: cesarean: low cervical, transverse and GBS prophylaxis- C/S due to arrest of descent after pushing x 2.5 hours  Postpartum Procedures: none  Complications-Operative and Postpartum: left lateral uterine tear extending into the broad ligament and posterior uterine perforation   Eating, drinking, voiding, ambulating well.  +flatus.  Lochia and pain wnl.  Denies dizziness, lightheadedness, or sob. No complaints.   Hemoglobin   Date  Value  Range  Status   09/30/2013  8.1*  12.0 - 15.0 g/dL  Final   1/61/0960  45.4  12.2 - 16.2 g/dL  Final      HCT   Date  Value  Range  Status   09/30/2013  23.3*  36.0 - 46.0 %  Final      HCT, POC   Date  Value  Range  Status   07/18/2012  42.5  37.7 - 47.9 %  Final    BMET    Component  Value  Date/Time    NA  133*  09/29/2013 1125    K  3.7  09/29/2013 1125    CL  100  09/29/2013 1125    CO2  26  09/29/2013 1125    GLUCOSE  106*  09/29/2013 1125    BUN  8  09/29/2013 1125    CREATININE  0.91  09/29/2013 1125    CREATININE  0.76  07/18/2012 2054    CALCIUM  8.5  09/29/2013 1125    GFRNONAA  80*  09/29/2013 1125    GFRAA  >90  09/29/2013 1125    Carla Sherman is a 37yo G2P1001 at 39.3wks who was admitted in the early evening on 10/29 with PROM that afternoon. Her preg has been followed by the [facility] and it has been essentially unremarkable other than at 34wks the placenta was noted to be low-lying 1.4cm from the os. Shortly after admission she was started on Pitocin in order to achieve active labor. The labor progress was protracted and she also had an episode of mod vag bldg which stabilized. By the early evening of 10/30 she became completely dilated and pushed x 2.5 hours with minimal descent of the vtx. C/S was offered to the pt and she agreed to proceed. (See Op Note for details on that procedure.) On POD#1 the  pt was doing well but it was decided that she would have a CT to evaluate the extension of her uterine tear- the scan was negative. She was noted to have asymptomatic anemia and iron was started. On POD#2 she is deemed to have received the full benefit of her hospital stay and she will be discharged home. She is breastfeeding and her husband will have a vasectomy.  Physical Exam:  General: alert, cooperative and mild distress  Heart: RRR  Lungs: nl effort  Lochia: appropriate  Uterine Fundus: firm  Incision: honeycomb dressing intact, edges appear well approximated, no active bleeding DVT Evaluation: No evidence of DVT seen on physical exam.  2+ BLE edema  Discharge Diagnoses: Term Pregnancy-delivered; PostOp Anemia  Discharge Information:  Date: 09/30/2013  Activity: pelvic rest  Diet: routine  Medications: PNV, Ibuprofen, Iron and Percocet  Condition: stable  Instructions: refer to practice specific booklet  Discharge to: home  Follow-up Information    Follow up with [facility]. (Make an appointment for a wound check in 2 weeks, and then a postpartum appointment in  6 weeks.)    Contact information:    [address]  [place] [address]  [phone]      Newborn Data:  Live born female  Birth Weight: 6 lb 10.7 oz (3025 g)  APGAR: 8, 9  Home with mother.  Breastfeeding Contraception: plans for fob to get vasectomy  Cheral Marker, CNM, [facility] 10/01/2013 7:16 AM

## 2013-10-01 NOTE — Discharge Summary (Signed)
Attestation of Attending Supervision of Advanced Practitioner (CNM/NP): Evaluation and management procedures were performed by the Advanced Practitioner under my supervision and collaboration.  I have reviewed the Advanced Practitioner's note and chart, and I agree with the management and plan.  Khiyan Crace 10/01/2013 3:58 PM   

## 2013-10-01 NOTE — Lactation Note (Signed)
This note was copied from the chart of Carla Gizella [place]. Lactation Consultation Note     Follow up consult with this mom and baby, on DOL 3. Mom has been latching in cradle hold. i advised her to use cross cradle fr football hold,to enable assisting the baby to a deeper latch, and to protect the baby's breathing, for the first 3-4 weeks. Mom reports she like the football hold. Mom knows to call lactation  for questions/concerns, as needed  Patient Name: Carla Sherman Date: 10/01/2013     Maternal Data    Feeding    LATCH Score/Interventions                      Lactation Tools Discussed/Used     Consult Status      Alfred Levins 10/01/2013, 5:35 PM

## 2013-10-01 NOTE — Discharge Summary (Signed)
Attestation of Attending Supervision of Advanced Practitioner (CNM/NP): Evaluation and management procedures were performed by the Advanced Practitioner under my supervision and collaboration.  I have reviewed the Advanced Practitioner's note and chart, and I agree with the management and plan.  HARRAWAY-SMITH, Xzaviar Maloof 9:15 AM

## 2013-10-01 NOTE — H&P (Signed)
Attestation of Attending Supervision of Advanced Practitioner: Evaluation and management procedures were performed by the PA/NP/CNM/OB Fellow under my supervision/collaboration. Chart reviewed and agree with management and plan.  Vaiden Adames V 10/01/2013 9:21 PM    

## 2013-10-02 ENCOUNTER — Encounter (HOSPITAL_COMMUNITY): Payer: Self-pay | Admitting: Family Medicine

## 2013-10-03 ENCOUNTER — Other Ambulatory Visit: Payer: BC Managed Care – PPO

## 2013-10-05 ENCOUNTER — Telehealth: Payer: Self-pay | Admitting: *Deleted

## 2013-10-05 ENCOUNTER — Telehealth: Payer: Self-pay | Admitting: General Practice

## 2013-10-05 NOTE — Telephone Encounter (Signed)
Patient called and left message stating she was wondering if it is okay to use an enema or not, she called earlier and spoke to someone. Called patient back and told her to try a dulcolax supp first and give it a full 24 hours to work. Patient verbalized understanding and had no further questions

## 2013-10-05 NOTE — Telephone Encounter (Signed)
Spoke with patient, patient has not had a BM since last Tuesday, Patient is post cesarean section.  Pt had metamucil to take is that enough?  Educated pt to take any otc medication for constipation.

## 2013-10-05 NOTE — Telephone Encounter (Signed)
Pt called nurse line desires call back from office.

## 2013-10-09 ENCOUNTER — Encounter: Payer: Self-pay | Admitting: Obstetrics and Gynecology

## 2013-11-01 ENCOUNTER — Ambulatory Visit (INDEPENDENT_AMBULATORY_CARE_PROVIDER_SITE_OTHER): Payer: BC Managed Care – PPO | Admitting: Obstetrics and Gynecology

## 2013-11-01 ENCOUNTER — Encounter: Payer: Self-pay | Admitting: Obstetrics and Gynecology

## 2013-11-01 NOTE — Patient Instructions (Signed)
Postpartum Care After Cesarean Delivery °After you deliver your newborn (postpartum period), the usual stay in the hospital is 24 72 hours. If there were problems with your labor or delivery, or if you have other medical problems, you might be in the hospital longer.  °While you are in the hospital, you will receive help and instructions on how to care for yourself and your newborn during the postpartum period.  °While you are in the hospital: °· It is normal for you to have pain or discomfort from the incision in your abdomen. Be sure to tell your nurses when you are having pain, where the pain is located, and what makes the pain worse. °· If you are breastfeeding, you may feel uncomfortable contractions of your uterus for a couple of weeks. This is normal. The contractions help your uterus get back to normal size. °· It is normal to have some bleeding after delivery. °· For the first 1 3 days after delivery, the flow is red and the amount may be similar to a period. °· It is common for the flow to start and stop. °· In the first few days, you may pass some small clots. Let your nurses know if you begin to pass large clots or your flow increases. °· Do not  flush blood clots down the toilet before having the nurse look at them. °· During the next 3 10 days after delivery, your flow should become more watery and pink or brown-tinged in color. °· Ten to fourteen days after delivery, your flow should be a small amount of yellowish-white discharge. °· The amount of your flow will decrease over the first few weeks after delivery. Your flow may stop in 6 8 weeks. Most women have had their flow stop by 12 weeks after delivery. °· You should change your sanitary pads frequently. °· Wash your hands thoroughly with soap and water for at least 20 seconds after changing pads, using the toilet, or before holding or feeding your newborn. °· Your intravenous (IV) tubing will be removed when you are drinking enough fluids. °· The  urine drainage tube (urinary catheter) that was inserted before delivery may be removed within 6 8 hours after delivery or when feeling returns to your legs. You should feel like you need to empty your bladder within the first 6 8 hours after the catheter has been removed. °· In case you become weak, lightheaded, or faint, call your nurse before you get out of bed for the first time and before you take a shower for the first time. °· Within the first few days after delivery, your breasts may begin to feel tender and full. This is called engorgement. Breast tenderness usually goes away within 48 72 hours after engorgement occurs. You may also notice milk leaking from your breasts. If you are not breastfeeding, do not stimulate your breasts. Breast stimulation can make your breasts produce more milk. °· Spending as much time as possible with your newborn is very important. During this time, you and your newborn can feel close and get to know each other. Having your newborn stay in your room (rooming in) will help to strengthen the bond with your newborn. It will give you time to get to know your newborn and become comfortable caring for your newborn. °· Your hormones change after delivery. Sometimes the hormone changes can temporarily cause you to feel sad or tearful. These feelings should not last more than a few days. If these feelings last longer   than that, you should talk to your caregiver. °· If desired, talk to your caregiver about methods of family planning or contraception. °· Talk to your caregiver about immunizations. Your caregiver may want you to have the following immunizations before leaving the hospital: °· Tetanus, diphtheria, and pertussis (Tdap) or tetanus and diphtheria (Td) immunization. It is very important that you and your family (including grandparents) or others caring for your newborn are up-to-date with the Tdap or Td immunizations. The Tdap or Td immunization can help protect your newborn  from getting ill. °· Rubella immunization. °· Varicella (chickenpox) immunization. °· Influenza immunization. You should receive this annual immunization if you did not receive the immunization during your pregnancy. °Document Released: 08/10/2012 Document Reviewed: 08/10/2012 °[employer]® Patient Information ©2014 [employer]. ° °

## 2013-11-01 NOTE — Progress Notes (Signed)
  Subjective:     Carla Sherman is a 37 y.o. female G2P2002who presents for a postpartum visit. She is 5 weeks postpartum following a low cervical transverse Cesarean section. PROM and IOL, pushed 2-3 hrs and had arrested descent, some malpositioning of fetal head. Disappointed but understands. I have fully reviewed the prenatal and intrapartum course. The delivery was at 39.2 gestational weeks. Outcome: primary cesarean section, low transverse incision. Anesthesia: epidural. Postpartum course has been uncomplicated. Baby's course has been uncomplicated. Baby is feeding by breast. Bleeding no bleeding. Bowel function is normal. Bladder function is normal. Patient is not sexually active. Contraception method is abstinence. Postpartum depression screening: negative.  The following portions of the patient's history were reviewed and updated as appropriate: allergies, current medications, past family history, past medical history, past social history, past surgical history and problem list.  Review of Systems Pertinent items are noted in HPI.   Objective:    BP 109/70  Pulse 67  Temp(Src) 97.5 F (36.4 C) (Oral)  Resp 20  Ht 5\' 2"  (1.575 m)  Wt 83.689 kg (184 lb 8 oz)  BMI 33.74 kg/m2  Breastfeeding? Yes  General:  alert, cooperative and no distress   Breasts:  inspection negative, no nipple discharge or bleeding, no masses or nodularity palpable mid  rt axilla with 2 cm area soft swelling, NT, mobile ? mammary tissue  Lungs: clear to auscultation bilaterally  Heart:  regular rate and rhythm, S1, S2 normal, no murmur, click, rub or gallop  Abdomen: Incision well healed, some induration along right margin and above incision, NT, no erythema. Large diastasis   Vulva:  not evaluated  Vagina: not evaluated  Cervix:  not evaluated  Corpus: not examined  Adnexa:  not evaluated  Rectal Exam: Not performed.        Assessment:     5 wks postpartum exam. Pap smear not done at today's  visit.   Plan:    1. Contraception: vasectomy planned for husband 2. Pap in 2 months. Had HR HPV and normal Pap 12/28/12 3. Follow up in: 2 months or as needed.  Gradually increase exercise activity

## 2014-09-13 ENCOUNTER — Encounter (HOSPITAL_COMMUNITY): Payer: Self-pay | Admitting: Emergency Medicine

## 2014-09-13 ENCOUNTER — Emergency Department (HOSPITAL_COMMUNITY)
Admission: EM | Admit: 2014-09-13 | Discharge: 2014-09-13 | Disposition: A | Discharge status: Home / Self Care | Payer: BC Managed Care – PPO | Attending: Emergency Medicine | Admitting: Emergency Medicine

## 2014-09-13 DIAGNOSIS — Z87440 Personal history of urinary (tract) infections: Secondary | ICD-10-CM | POA: Insufficient documentation

## 2014-09-13 DIAGNOSIS — N3090 Cystitis, unspecified without hematuria: Secondary | ICD-10-CM | POA: Insufficient documentation

## 2014-09-13 DIAGNOSIS — Z8701 Personal history of pneumonia (recurrent): Secondary | ICD-10-CM | POA: Insufficient documentation

## 2014-09-13 DIAGNOSIS — Z8679 Personal history of other diseases of the circulatory system: Secondary | ICD-10-CM | POA: Insufficient documentation

## 2014-09-13 DIAGNOSIS — Z792 Long term (current) use of antibiotics: Secondary | ICD-10-CM | POA: Insufficient documentation

## 2014-09-13 DIAGNOSIS — Z3202 Encounter for pregnancy test, result negative: Secondary | ICD-10-CM | POA: Insufficient documentation

## 2014-09-13 LAB — URINALYSIS, ROUTINE W REFLEX MICROSCOPIC
BILIRUBIN URINE: NEGATIVE
GLUCOSE, UA: NEGATIVE mg/dL
KETONES UR: NEGATIVE mg/dL
Nitrite: NEGATIVE
PH: 6 (ref 5.0–8.0)
Protein, ur: 30 mg/dL — AB
Specific Gravity, Urine: 1.009 (ref 1.005–1.030)
Urobilinogen, UA: 0.2 mg/dL (ref 0.0–1.0)

## 2014-09-13 LAB — URINE MICROSCOPIC-ADD ON

## 2014-09-13 LAB — POC URINE PREG, ED: Preg Test, Ur: NEGATIVE

## 2014-09-13 MED ORDER — CEPHALEXIN 500 MG PO CAPS
500.0000 mg | ORAL_CAPSULE | Freq: Four times a day (QID) | ORAL | Status: DC
Start: 2014-09-13 — End: 2017-02-10

## 2014-09-13 MED ORDER — ONDANSETRON HCL 4 MG PO TABS: 4.0000 mg | ORAL_TABLET | Freq: Three times a day (TID) | ORAL | Status: DC | PRN

## 2014-09-13 NOTE — ED Provider Notes (Signed)
CSN: [id number]     Arrival date & time 09/13/14  [id number] History   First MD Initiated Contact with Patient 09/13/14 [phone]     Chief Complaint  Patient presents with  . Urinary Tract Infection     (Consider location/radiation/quality/duration/timing/severity/associated sxs/prior Treatment) Patient is a 38 y.o. female presenting with urinary tract infection.  Urinary Tract Infection This is a recurrent problem. The current episode started 2 days ago. The problem occurs constantly. The problem has not changed since onset.Associated symptoms include abdominal pain (intermittent bil flank and lower back pain radiating to bil adnexa). Pertinent negatives include no chest pain. Nothing aggravates the symptoms. Nothing relieves the symptoms. She has tried nothing for the symptoms.    Past Medical History  Diagnosis Date  . Pneumonia 2008  . Pregnancy induced hypertension     PP 2011  . Abnormal Pap smear   . Infection     UTI X 1   Past Surgical History  Procedure Laterality Date  . No past surgeries    . Cesarean section [name] 09/28/2013    Procedure: Primary Cesarean Section Delivery Baby Girl @ 2154, Apgars 8/9;  Surgeon: Reva Boresanya S Pratt, MD;  Location: [facility];  Service: Obstetrics;  Laterality: [name];   Family History  Problem Relation Age of Onset  . Diabetes Mother   . Arthritis Maternal Aunt   . Cancer Maternal Aunt     BREAST  . Diabetes Maternal Aunt   . Arthritis Maternal Grandmother   . Heart disease Maternal Grandmother   . Cancer Maternal Grandfather     PROSTATE  . Cancer Paternal Grandmother     PANCREATIC  . Dementia Paternal Grandmother   . Cancer Paternal Grandfather     LUNG  . Cancer Maternal Aunt     BREAST  . Early death Maternal Aunt     DIED IN 6840'S  . Diabetes Maternal Aunt   . Cancer Cousin     LYMPHOMA  . Thyroid disease Cousin    History  Substance Use Topics  . Smoking status: Never Smoker   . Smokeless tobacco: Never Used  . Alcohol Use: Yes      Comment: social   OB History   Grav Para Term Preterm Abortions TAB SAB Ect Mult Living   2 2 2       2      Obstetric Comments   2011 PP INCREASED BP; ON MED X 1 WEEK     Review of Systems  Cardiovascular: Negative for chest pain.  Gastrointestinal: Positive for abdominal pain (intermittent bil flank and lower back pain radiating to bil adnexa).  All other systems reviewed and are negative.     Allergies  Review of patient's allergies indicates no known allergies.  Home Medications   Prior to Admission medications   Medication Sig Start Date End Date Taking? Authorizing Provider  phenazopyridine (PYRIDIUM) 95 MG tablet Take 95 mg by mouth 3 (three) times daily as needed for pain.   Yes Historical Provider, MD  cephALEXin (KEFLEX) 500 MG capsule Take 1 capsule (500 mg total) by mouth 4 (four) times daily. 09/13/14   Mirian MoMatthew Christiano Blandon, MD  ondansetron (ZOFRAN) 4 MG tablet Take 1 tablet (4 mg total) by mouth every 8 (eight) hours as needed for nausea or vomiting. 09/13/14   Mirian MoMatthew Kerilyn Cortner, MD   BP 132/79  Pulse 76  Temp(Src) 98.2 F (36.8 C) (Oral)  Resp 18  Ht 5\' 2"  (1.575 m)  Wt 170 lb (77.111 kg)  BMI 31.09 kg/m2  SpO2 99%  LMP 09/08/2014 Physical Exam  Vitals reviewed. Constitutional: She is oriented to person, place, and time. She appears well-developed and well-nourished.  HENT:  Head: Normocephalic and atraumatic.  Right Ear: External ear normal.  Left Ear: External ear normal.  Eyes: Conjunctivae and EOM are normal. Pupils are equal, round, and reactive to light.  Neck: Normal range of motion. Neck supple.  Cardiovascular: Normal rate, regular rhythm, normal heart sounds and intact distal pulses.   Pulmonary/Chest: Effort normal and breath sounds normal.  Abdominal: Soft. Bowel sounds are normal. There is no tenderness. There is no CVA tenderness.  Musculoskeletal: Normal range of motion.  Neurological: She is alert and oriented to person, place, and time.   Skin: Skin is warm and dry.    ED Course  Procedures (including critical care time) Labs Review Labs Reviewed  URINALYSIS, ROUTINE W REFLEX MICROSCOPIC - Abnormal; Notable for the following:    APPearance CLOUDY (*)    Hgb urine dipstick SMALL (*)    Protein, ur 30 (*)    Leukocytes, UA LARGE (*)    All other components within normal limits  URINE MICROSCOPIC-ADD ON  POC URINE PREG, ED    Imaging Review No results found.   EKG Interpretation None      MDM   Final diagnoses:  Cystitis    38 y.o. female with pertinent PMH of prior UTI presents with recurrent dysuria, bil flank and lumbar pain with nausea x 2-3 days.  No fevers.  Pt states pain is intermittent and crampy, questionably worse on L.  On arrival vitals and physical exam as above.  ? L CVA tenderness.  Discussed crampy nature of questionable unilateral symptoms and possibility of occult nephrolithiasis given hematuria, and pain, however pt is concerned about cost and refuses CT scan at this time.  As she is well appearing and stated that her pain was not that severe, consider this reasonable.  She was dc home with strict return precautions and that if her symptoms did not improve in 48 hours she should return here.  DC home in stable condition.    1. Cystitis         Mirian MoMatthew Reginae Wolfrey, MD 09/13/14 [phone]

## 2014-09-13 NOTE — Discharge Instructions (Signed)

## 2014-09-13 NOTE — ED Notes (Signed)
PT c/o burning, frequency and urgency that began last week; pt c/o "aching" to her kidneys and lower abd area; denies N/V

## 2014-10-01 ENCOUNTER — Encounter (HOSPITAL_COMMUNITY): Payer: Self-pay | Admitting: Emergency Medicine

## 2014-10-10 IMAGING — CT CT ABD-PELV W/ CM
1 of 3 series · 14 of 32 positions shown, 19 images · IV contrast (OMNIPAQUE)
Comparison: None.

CLINICAL DATA: Status post C-section. Evaluate left ureter for
injury or kink

EXAM:
CT ABDOMEN AND PELVIS WITH CONTRAST
TECHNIQUE: Multidetector CT imaging of the abdomen and pelvis was performed
using the standard protocol following bolus administration of
intravenous contrast.
CONTRAST:  100mL OMNIPAQUE IOHEXOL 300 MG/ML  SOLN

[Series 2: routine abdomen/pelvis with · axial · 0.79mm/px · z∈[-423,-48]mm · 14 of 87 slices shown, 19 images]
[im 6/87  soft-tissue]
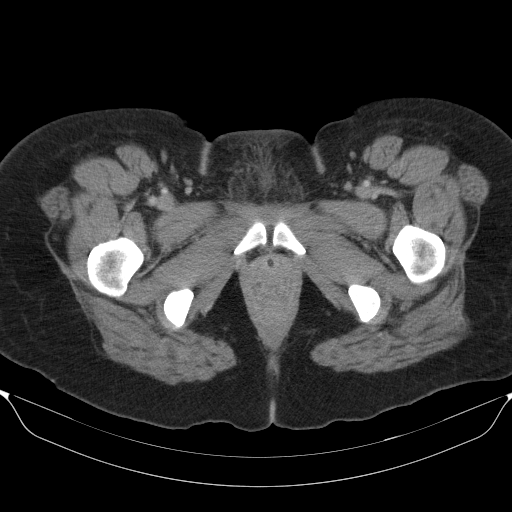
[im 6/87  bone]
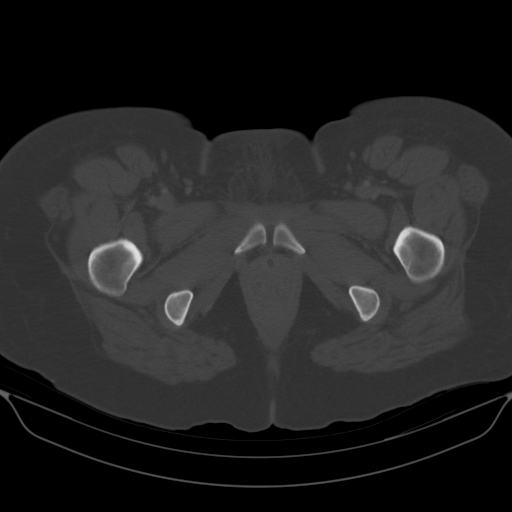
[im 11/87  soft-tissue]
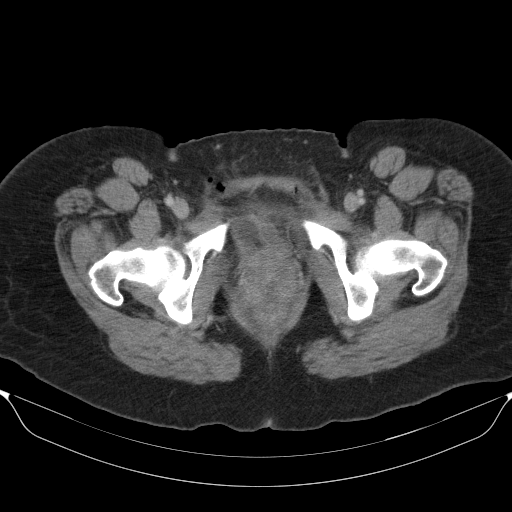
[im 17/87  soft-tissue]
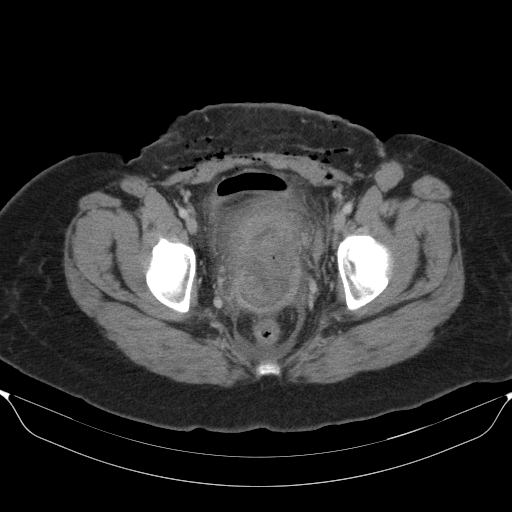
[im 27/87  soft-tissue]
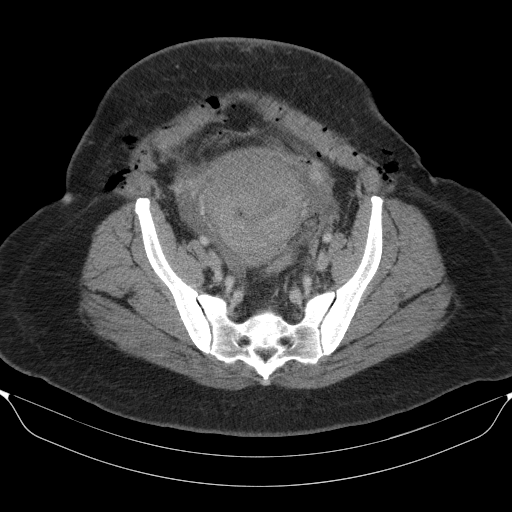
[im 33/87  soft-tissue]
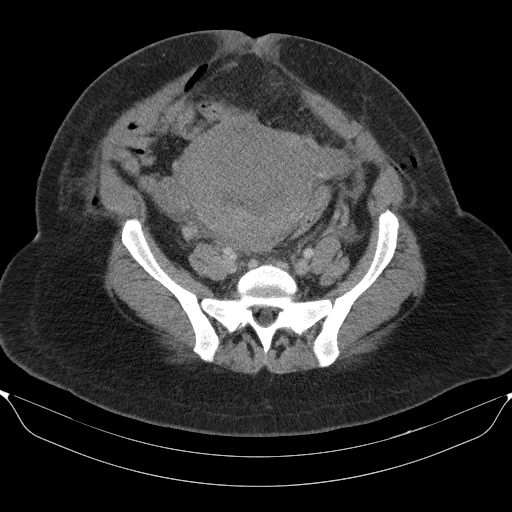
[im 38/87  soft-tissue]
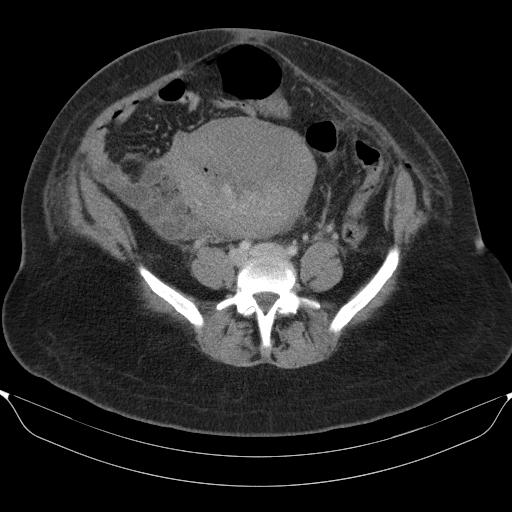
[im 44/87  soft-tissue]
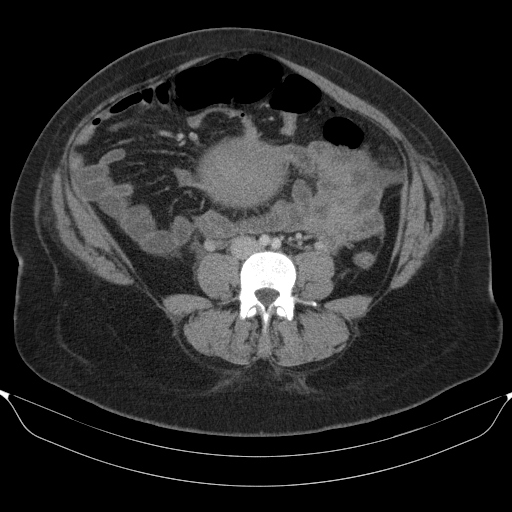
[im 49/87  soft-tissue]
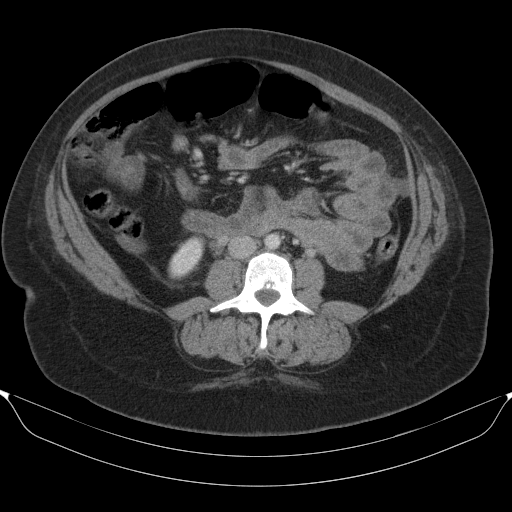
[im 54/87  soft-tissue]
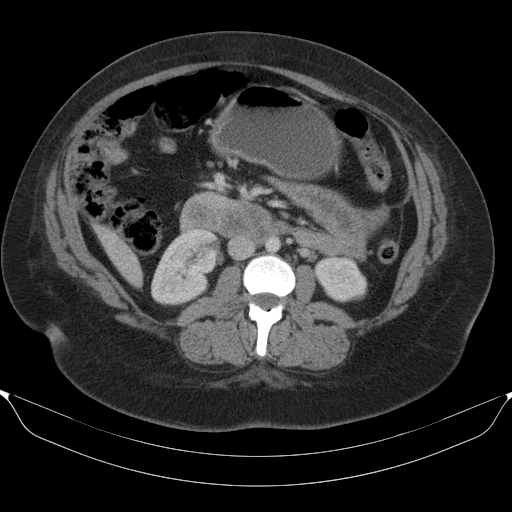
[im 54/87  bone]
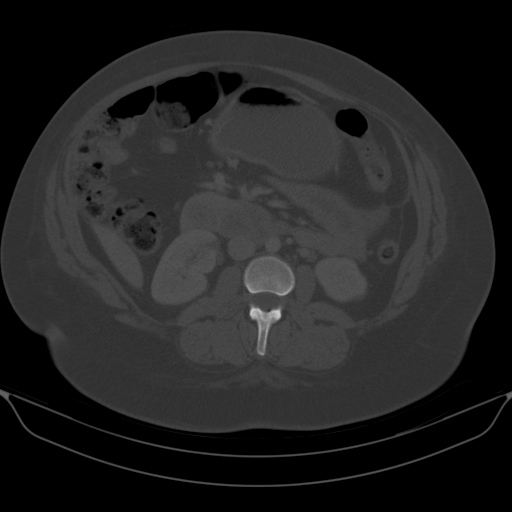
[im 60/87  soft-tissue]
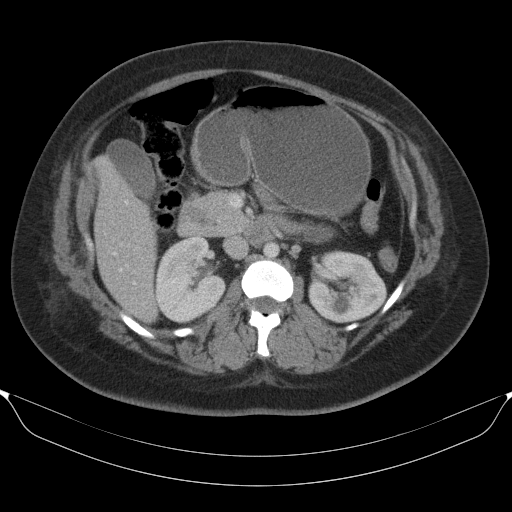
[im 65/87  lung]
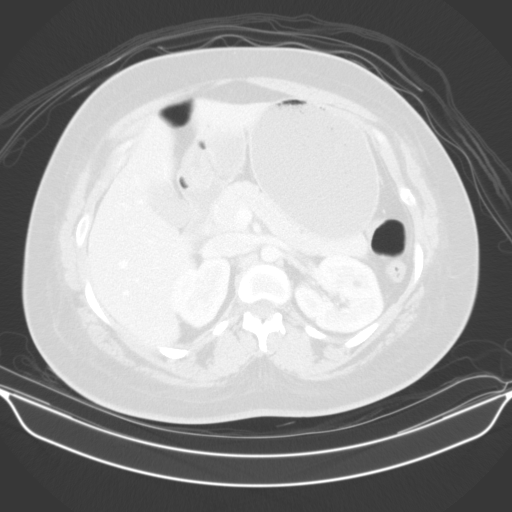
[im 70/87  soft-tissue]
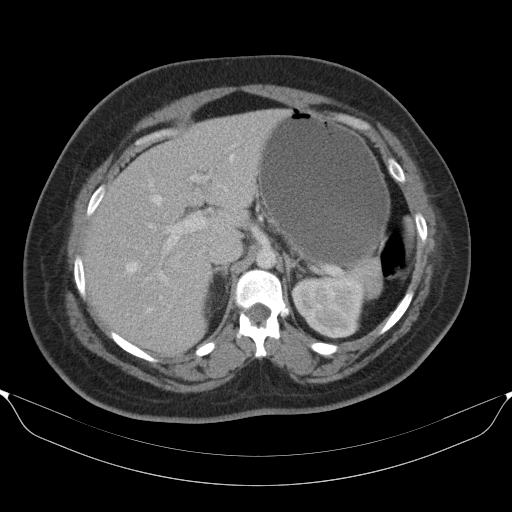
[im 70/87  lung]
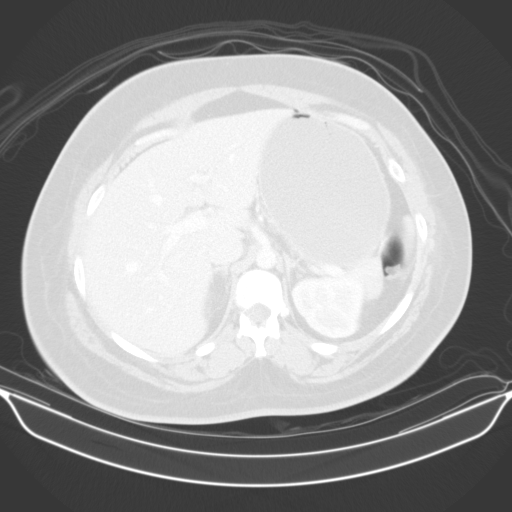
[im 76/87  soft-tissue]
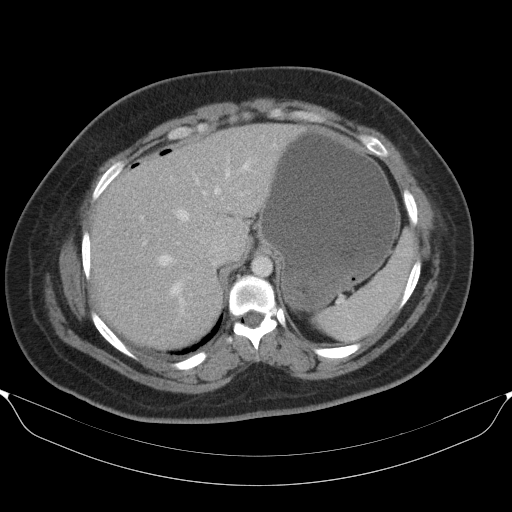
[im 76/87  lung]
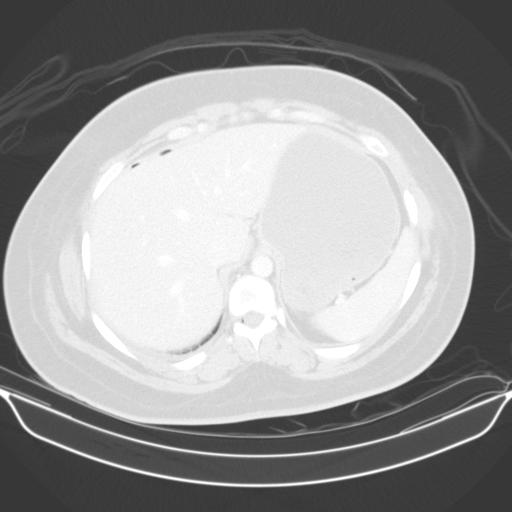
[im 81/87  soft-tissue]
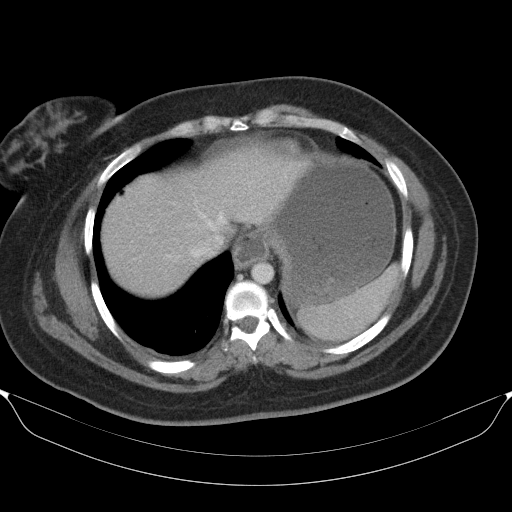
[im 81/87  lung]
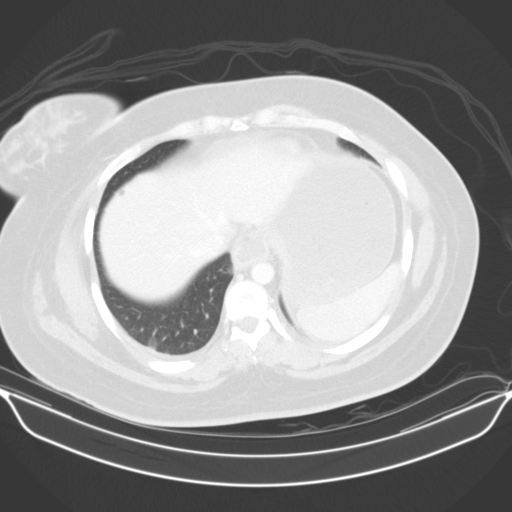

[14 of 32 positions shown; findings below may reference images not displayed]

FINDINGS: Very small bilateral pleural effusions noted. No focal liver
abnormality identified. The gallbladder appears normal. There is no
biliary dilatation suggestion of head an annular pancreas. The
remainder of the pancreas appears normal. The spleen is within
normal limits

Normal appearance of the adrenal glands. Small hypodensities are
noted in both kidneys which likely represent cysts. There is a tiny
stone within the lower pole of the left kidney measuring 2-3 mm,
image 29/series 2. The stress that there is no significant
hydronephrosis. On the delayed images there is no extravasation of
contrast material. Urinary bladder appears normal. There is a Foley
catheter within the bladder. Postoperative appearance of the uterus
compatible with C-section. The uterus remains edematous. There is
gas, fluid and a heterogeneous material within the uterine cavity.
Gas is identified within the peritoneal cavity as well as the
ventral pelvic wall compatible with recent surgery. A small amount
of gas within the urinary bladder which likely reflects recent
instrumentation.

Normal caliber of the abdominal aorta. No upper abdominal or pelvic
adenopathy.

There is moderate distension of the gastric lumen with fluid. The
small bowel loops have a normal course and caliber. Normal
appearance of the colon.

A small amount of free fluid is noted within the abdomen and pelvis.
IMPRESSION: 1. Postoperative appearance of the abdomen and pelvis compatible
with recent C-section.

2. There are no specific features identified to suggest ureteral
injury. No evidence for hydronephrosis or urinoma.

3. Left renal calculi.

## 2016-09-22 ENCOUNTER — Other Ambulatory Visit: Payer: Self-pay | Admitting: Obstetrics and Gynecology

## 2016-09-22 DIAGNOSIS — M545 Low back pain: Secondary | ICD-10-CM

## 2016-09-22 DIAGNOSIS — R1030 Lower abdominal pain, unspecified: Secondary | ICD-10-CM

## 2016-09-25 ENCOUNTER — Other Ambulatory Visit: Payer: Self-pay

## 2016-10-02 ENCOUNTER — Ambulatory Visit
Admission: RE | Admit: 2016-10-02 | Discharge: 2016-10-02 | Disposition: A | Discharge status: Home / Self Care | Payer: BLUE CROSS/BLUE SHIELD | Source: Ambulatory Visit | Attending: Obstetrics and Gynecology | Admitting: Obstetrics and Gynecology

## 2016-10-02 DIAGNOSIS — M545 Low back pain: Secondary | ICD-10-CM

## 2016-10-02 DIAGNOSIS — R935 Abnormal findings on diagnostic imaging of other abdominal regions, including retroperitoneum: Secondary | ICD-10-CM | POA: Diagnosis not present

## 2016-10-02 DIAGNOSIS — R1030 Lower abdominal pain, unspecified: Secondary | ICD-10-CM

## 2016-10-06 DIAGNOSIS — R109 Unspecified abdominal pain: Secondary | ICD-10-CM | POA: Diagnosis not present

## 2017-02-09 ENCOUNTER — Encounter: Payer: Self-pay | Admitting: Emergency Medicine

## 2017-02-09 ENCOUNTER — Emergency Department
Admission: EM | Admit: 2017-02-09 | Discharge: 2017-02-10 | Disposition: A | Discharge status: Home / Self Care | Payer: BLUE CROSS/BLUE SHIELD | Attending: Emergency Medicine | Admitting: Emergency Medicine

## 2017-02-09 DIAGNOSIS — R110 Nausea: Secondary | ICD-10-CM | POA: Diagnosis not present

## 2017-02-09 DIAGNOSIS — R1031 Right lower quadrant pain: Secondary | ICD-10-CM | POA: Diagnosis not present

## 2017-02-09 DIAGNOSIS — R109 Unspecified abdominal pain: Secondary | ICD-10-CM | POA: Diagnosis not present

## 2017-02-09 DIAGNOSIS — Z79899 Other long term (current) drug therapy: Secondary | ICD-10-CM | POA: Diagnosis not present

## 2017-02-09 LAB — URINALYSIS, COMPLETE (UACMP) WITH MICROSCOPIC
BILIRUBIN URINE: NEGATIVE
Bacteria, UA: NONE SEEN
GLUCOSE, UA: NEGATIVE mg/dL
Hgb urine dipstick: NEGATIVE
Ketones, ur: NEGATIVE mg/dL
LEUKOCYTES UA: NEGATIVE
Nitrite: NEGATIVE
Protein, ur: NEGATIVE mg/dL
SPECIFIC GRAVITY, URINE: 1.023 (ref 1.005–1.030)
pH: 5 (ref 5.0–8.0)

## 2017-02-09 LAB — POC URINE PREG, ED: Preg Test, Ur: NEGATIVE

## 2017-02-09 NOTE — ED Triage Notes (Signed)
Pt in with co rlq pain x 2 weeks nausea, and decreased appetite. Denies any dysuria, pt concerned about appendicitis.

## 2017-02-10 ENCOUNTER — Encounter: Payer: Self-pay | Admitting: Radiology

## 2017-02-10 ENCOUNTER — Emergency Department: Payer: BLUE CROSS/BLUE SHIELD

## 2017-02-10 DIAGNOSIS — R109 Unspecified abdominal pain: Secondary | ICD-10-CM | POA: Diagnosis not present

## 2017-02-10 DIAGNOSIS — R1031 Right lower quadrant pain: Secondary | ICD-10-CM | POA: Diagnosis not present

## 2017-02-10 LAB — COMPREHENSIVE METABOLIC PANEL
ALBUMIN: 3.8 g/dL (ref 3.5–5.0)
ALK PHOS: 45 U/L (ref 38–126)
ALT: 15 U/L (ref 14–54)
AST: 24 U/L (ref 15–41)
Anion gap: 5 (ref 5–15)
BUN: 19 mg/dL (ref 6–20)
CHLORIDE: 105 mmol/L (ref 101–111)
CO2: 26 mmol/L (ref 22–32)
CREATININE: 0.68 mg/dL (ref 0.44–1.00)
Calcium: 8.9 mg/dL (ref 8.9–10.3)
GFR calc Af Amer: >60 mL/min (ref >60)
GFR calc non Af Amer: >60 mL/min (ref >60)
GLUCOSE: 108 mg/dL — AB (ref 65–99)
Potassium: 3.3 mmol/L — ABNORMAL LOW (ref 3.5–5.1)
SODIUM: 136 mmol/L (ref 135–145)
Total Bilirubin: 0.3 mg/dL (ref 0.3–1.2)
Total Protein: 7.4 g/dL (ref 6.5–8.1)

## 2017-02-10 LAB — CBC WITH DIFFERENTIAL/PLATELET
BASOS PCT: 1 %
Basophils Absolute: 0.1 10*3/uL (ref 0–0.1)
EOS PCT: 3 %
Eosinophils Absolute: 0.2 10*3/uL (ref 0–0.7)
HCT: 36.3 % (ref 35.0–47.0)
Hemoglobin: 12.4 g/dL (ref 12.0–16.0)
Lymphocytes Relative: 36 %
Lymphs Abs: 2.4 10*3/uL (ref 1.0–3.6)
MCH: 29.8 pg (ref 26.0–34.0)
MCHC: 34.2 g/dL (ref 32.0–36.0)
MCV: 87.1 fL (ref 80.0–100.0)
MONO ABS: 0.3 10*3/uL (ref 0.2–0.9)
Monocytes Relative: 5 %
NEUTROS ABS: 3.6 10*3/uL (ref 1.4–6.5)
Neutrophils Relative %: 55 %
Platelets: 269 10*3/uL (ref 150–440)
RBC: 4.17 MIL/uL (ref 3.80–5.20)
RDW: 12.6 % (ref 11.5–14.5)
WBC: 6.6 10*3/uL (ref 3.6–11.0)

## 2017-02-10 LAB — LIPASE, BLOOD: Lipase: 22 U/L (ref 11–51)

## 2017-02-10 MED ORDER — IOPAMIDOL (ISOVUE-300) INJECTION 61%
30.0000 mL | Freq: Once | INTRAVENOUS | Status: AC | PRN
  Administered 2017-02-10: 30 mL via ORAL

## 2017-02-10 MED ORDER — IOPAMIDOL (ISOVUE-300) INJECTION 61%
100.0000 mL | Freq: Once | INTRAVENOUS | Status: AC | PRN
  Administered 2017-02-10: 100 mL via INTRAVENOUS

## 2017-02-10 NOTE — Discharge Instructions (Signed)
Please follow up with there acute care clinic for further evaluation

## 2017-02-10 NOTE — ED Notes (Signed)
Pt to CT

## 2017-02-10 NOTE — ED Provider Notes (Signed)
[facility] Emergency Department Provider Note   ____________________________________________   First MD Initiated Contact with Patient 02/09/17 2329     (approximate)  I have reviewed the triage vital signs and the nursing notes.   HISTORY  Chief Complaint Abdominal Pain    HPI Carla Sherman is a 41 y.o. female who comes into the hospital today concerned about appendicitis. The patient reports that she has the symptoms of appendicitis. She has some right-sided abdominal pain with some nausea and fullness in her abdomen. She also reports that she's had some decreased appetite and mild watering. The patient reports she is not pregnant and this is going on for almost 2 weeks. She's had a low-grade temperature at home. The patient reports that the pain is not constant but it jabs every now and then. She has not been taking anything for pain. The patient's last menstrual period was February 24 and she reports it was normal. She did have some chest pain about a week ago but she reports that it made her pain attention. She hasn't had it again but she reports is chronic, and gone intermittently as well. The patient is here today for evaluation of these symptoms.   Past Medical History:  Diagnosis Date  . Abnormal Pap smear   . Infection    UTI X 1  . Pneumonia 2008  . Pregnancy induced hypertension    PP 2011    Patient Active Problem List   Diagnosis Date Noted  . Irregular menses 12/28/2012  . 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 CESAREAN SECTION [name] 09/28/2013   Procedure: Primary Cesarean Section Delivery Baby Girl @ 2154, Apgars 8/9;  Surgeon: Reva Bores, MD;  Location: [facility];  Service: Obstetrics;  Laterality: [name];  . NO PAST SURGERIES      Prior to Admission medications   Medication Sig Start Date End Date Taking? Authorizing Provider  ondansetron (ZOFRAN) 4 MG  tablet Take 1 tablet (4 mg total) by mouth every 8 (eight) hours as needed for nausea or vomiting. 09/13/14   Mirian Mo, MD  phenazopyridine (PYRIDIUM) 95 MG tablet Take 95 mg by mouth 3 (three) times daily as needed for pain.    Historical Provider, MD    Allergies Patient has no known allergies.  Family History  Problem Relation Age of Onset  . Diabetes Mother   . Arthritis Maternal Grandmother   . Heart disease Maternal Grandmother   . Cancer Paternal Grandmother     PANCREATIC  . Dementia Paternal Grandmother   . Arthritis Maternal Aunt   . Cancer Maternal Aunt     BREAST  . Diabetes Maternal Aunt   . Cancer Maternal Grandfather     PROSTATE  . Cancer Paternal Grandfather     LUNG  . Cancer Maternal Aunt     BREAST  . Early death Maternal Aunt     DIED IN 26'S  . Diabetes Maternal Aunt   . Cancer Cousin     LYMPHOMA  . Thyroid disease Cousin     Social History Social History  Substance Use Topics  . Smoking status: Never Smoker  . Smokeless tobacco: Never Used  . Alcohol use Yes     Comment: social    Review of Systems Constitutional: No fever/chills Eyes: No visual changes. ENT: No sore throat. Cardiovascular: Denies chest pain. Respiratory: Denies shortness of breath. Gastrointestinal:  abdominal pain, nausea, no  vomiting.  No diarrhea.  No constipation. Genitourinary: Negative for dysuria. Musculoskeletal: Negative for back pain. Skin: Negative for rash. Neurological: Negative for headaches, focal weakness or numbness.  10-point ROS otherwise negative.  ____________________________________________   PHYSICAL EXAM:  VITAL SIGNS: ED Triage Vitals  Enc Vitals Group     BP 02/09/17 2233 (!) 127/51     Pulse Rate 02/09/17 2233 74     Resp 02/09/17 2233 20     Temp 02/09/17 2233 98.7 F (37.1 C)     Temp Source 02/09/17 2233 Oral     SpO2 02/09/17 2233 98 %     Weight 02/09/17 2233 180 lb (81.6 kg)     Height 02/09/17 2233 5\' 2"  (1.575 m)       Head Circumference --      Peak Flow --      Pain Score 02/10/17 0132 0     Pain Loc --      Pain Edu? --      Excl. in GC? --     Constitutional: Alert and oriented. Well appearing and in mild distress. Eyes: Conjunctivae are normal. PERRL. EOMI. Head: Atraumatic. Nose: No congestion/rhinnorhea. Mouth/Throat: Mucous membranes are moist.  Oropharynx non-erythematous. Cardiovascular: Normal rate, regular rhythm. Grossly normal heart sounds.  Good peripheral circulation. Respiratory: Normal respiratory effort.  No retractions. Lungs CTAB. Gastrointestinal: Soft with mild RLQ tenderness to palpation. No distention. Positive bowel sounds Genitourinary: declined Musculoskeletal: No lower extremity tenderness nor edema.  Neurologic:  Normal speech and language.  Skin:  Skin is warm, dry and intact.  Psychiatric: Mood and affect are normal.   ____________________________________________   LABS (all labs ordered are listed, but only abnormal results are displayed)  Labs Reviewed  URINALYSIS, COMPLETE (UACMP) WITH MICROSCOPIC - Abnormal; Notable for the following:       Result Value   Color, Urine YELLOW (*)    APPearance CLEAR (*)    Squamous Epithelial / LPF 0-5 (*)    All other components within normal limits  COMPREHENSIVE METABOLIC PANEL - Abnormal; Notable for the following:    Potassium 3.3 (*)    Glucose, Bld 108 (*)    All other components within normal limits  CBC WITH DIFFERENTIAL/PLATELET  LIPASE, BLOOD  CBC WITH DIFFERENTIAL/PLATELET  POC URINE PREG, ED   ____________________________________________  EKG  none ____________________________________________  RADIOLOGY  US pelvis CT abd and pelvis ____________________________________________   PROCEDURES  Procedure(s) performed: None  Procedures  Critical Care performed: No  ____________________________________________   INITIAL IMPRESSION / ASSESSMENT AND PLAN / ED COURSE  Pertinent labs &  imaging results that were available during my care of the patient were reviewed by me and considered in my medical decision making (see chart for details).  This is a 41 year old female who comes into the hospital today with some abdominal pain. 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 The patient declined getting a pelvic exam but I did send her for an ultrasound to ensure that she did not have a cyst. The patient has a cyst on the left but not on the right. I will send her for CT scan to insure that she does not have appendicitis and reassess the patient.  Clinical Course as of Feb 10 317  Wed Feb 10, 2017  0213 1. Dominant left ovarian cyst, likely physiologic. Unremarkable pelvic ultrasound. 2. Doppler detected flow to both ovaries. 3. Retroverted uterus.  US Pelvis Complete [AW]  0313 1. Segmental minimal thickened appearance of the transverse colon, likely related to underdistention. Colitis is less likely. Clinical correlation is recommended. No bowel obstruction. Normal appendix. 2. Small duodenal diverticulum. 3. A 2 mm nonobstructing left renal stone. No hydronephrosis. 4. Annular pancreas morphology. 5. A 1.8 cm left ovarian dominant follicle/cyst, physiologic.   CT Abdomen Pelvis W Contrast [AW]    Clinical Course User Index [AW] Rebecka Apley, MD   The patient's CT scan is unremarkable. She does have some thickened appearance of the transverse colon but they state that colitis is less likely. The patient should follow-up with the acute care clinic.   ____________________________________________   FINAL CLINICAL IMPRESSION(S) / ED DIAGNOSES  Final diagnoses:  Abdominal pain  Abdominal pain      NEW MEDICATIONS STARTED DURING THIS VISIT:  New Prescriptions   No medications on file     Note:  This document was prepared using Dragon voice recognition software and may include  unintentional dictation errors.    Rebecka Apley, MD 02/10/17 [phone]

## 2017-02-10 NOTE — ED Notes (Signed)
Pt up to restroom with steady gait. No distress noted.  

## 2017-07-26 ENCOUNTER — Telehealth: Payer: Self-pay | Admitting: Physician Assistant

## 2017-07-26 ENCOUNTER — Encounter: Payer: Self-pay | Admitting: Physician Assistant

## 2017-07-26 ENCOUNTER — Ambulatory Visit (HOSPITAL_COMMUNITY): Payer: BLUE CROSS/BLUE SHIELD

## 2017-07-26 ENCOUNTER — Ambulatory Visit (HOSPITAL_COMMUNITY)
Admission: RE | Admit: 2017-07-26 | Discharge: 2017-07-26 | Disposition: A | Discharge status: Home / Self Care | Payer: BLUE CROSS/BLUE SHIELD | Source: Ambulatory Visit | Attending: Physician Assistant | Admitting: Physician Assistant

## 2017-07-26 ENCOUNTER — Ambulatory Visit (INDEPENDENT_AMBULATORY_CARE_PROVIDER_SITE_OTHER): Payer: BLUE CROSS/BLUE SHIELD | Admitting: Physician Assistant

## 2017-07-26 VITALS — BP 107/72 | HR 72 | Temp 98.1°F | Resp 18 | Ht 62.0 in | Wt 190.2 lb

## 2017-07-26 DIAGNOSIS — R202 Paresthesia of skin: Secondary | ICD-10-CM

## 2017-07-26 DIAGNOSIS — R002 Palpitations: Secondary | ICD-10-CM | POA: Diagnosis not present

## 2017-07-26 DIAGNOSIS — K0889 Other specified disorders of teeth and supporting structures: Secondary | ICD-10-CM

## 2017-07-26 DIAGNOSIS — R531 Weakness: Secondary | ICD-10-CM

## 2017-07-26 DIAGNOSIS — R7303 Prediabetes: Secondary | ICD-10-CM

## 2017-07-26 DIAGNOSIS — R51 Headache: Secondary | ICD-10-CM | POA: Diagnosis not present

## 2017-07-26 LAB — POCT URINALYSIS DIP (MANUAL ENTRY)
Bilirubin, UA: NEGATIVE
Blood, UA: NEGATIVE
Glucose, UA: NEGATIVE mg/dL
Leukocytes, UA: NEGATIVE
Nitrite, UA: NEGATIVE
Protein Ur, POC: NEGATIVE mg/dL
Spec Grav, UA: 1.02 (ref 1.010–1.025)
UROBILINOGEN UA: 0.2 U/dL
pH, UA: 6.5 (ref 5.0–8.0)

## 2017-07-26 LAB — POCT CBC
Granulocyte percent: 55.8 %G (ref 37–80)
HEMATOCRIT: 37.9 % (ref 37.7–47.9)
Hemoglobin: 12.6 g/dL (ref 12.2–16.2)
Lymph, poc: 1.8 (ref 0.6–3.4)
MCH: 29.2 pg (ref 27–31.2)
MCHC: 33.3 g/dL (ref 31.8–35.4)
MCV: 87.6 fL (ref 80–97)
MID (cbc): 0.3 (ref 0–0.9)
MPV: 7 fL (ref 0–99.8)
POC GRANULOCYTE: 2.6 (ref 2–6.9)
POC LYMPH PERCENT: 38.4 %L (ref 10–50)
POC MID %: 5.8 %M (ref 0–12)
Platelet Count, POC: 311 10*3/uL (ref 142–424)
RBC: 4.32 M/uL (ref 4.04–5.48)
RDW, POC: 12.4 %
WBC: 4.7 10*3/uL (ref 4.6–10.2)

## 2017-07-26 LAB — POCT GLYCOSYLATED HEMOGLOBIN (HGB A1C): Hemoglobin A1C: 6

## 2017-07-26 LAB — GLUCOSE, POCT (MANUAL RESULT ENTRY): POC Glucose: 85 mg/dl (ref 70–99)

## 2017-07-26 NOTE — Progress Notes (Signed)
Patient ID: Carla Sherman, female     DOB: [birth year], 41 y.o.    MRN: [id number]  PCP: Patient, No Pcp Per  Chief Complaint  Patient presents with  . Numbness    pt states this morning when she was in a meeting she experienced numbness come over her left side of her face and left hand. Pt states she feels really off and that she feels like something is wrong.    Subjective:   This patient is new to me and presents for evaluation of numbness.  Last week "My heart was paining. Almost like it was aching." Also last week, or the week before, it felt like there was something crawling in her hair. "But I have come to realize that there was something in my brain that made it feel like something was crawling. I'm feeling a lot of movement in my brain. I just feel off. A heaviness in my brain."  This morning, she awoke with dental pain, at the site of a recent root canal. In a meeting this morning, she felt a movement in her brain that moved toward her LEFT eye, which started aching, then the numbness in the LEFT face and hand. She felt generalized weakness.  She stepped out and spoke with a former nurse. She went to a nearby urgent care, but they were too busy, so she came here.  The numbness in the hand is less than before, but not resolved. There has been no change in the numbness in her head. I feel off (not balanced, no spinning). Like, I don't feel centered. Maybe some numbness in the LEFT leg has developed.   No fever, chills. No GI or GU symptoms. Has been having headaches x a couple of weeks. Off and on, which she attributed to drinking a stronger than normal coffee, as the HAs came on while drinking coffee. No blurry or double vision. Not dropping things. Felt shaky today, no obvious tremor. A couple nights last week she didn't sleep well, which she attributed to her children starting school, but otherwise, sleep has been normal. RIGHT eye has been twitching x  over 2 weeks.  Diagnosed with prediabetes more than a year ago with her GYN. Notes that she has gained 20 lbs since then. Has just joined [employer].  Review of Systems  Constitutional: Positive for unexpected weight change. Negative for appetite change, chills, diaphoresis, fatigue and fever.  HENT: Positive for dental problem. Negative for congestion, drooling, ear discharge, ear pain, facial swelling, hearing loss, mouth sores, nosebleeds, postnasal drip, rhinorrhea, sinus pain, sinus pressure, sneezing, sore throat, tinnitus, trouble swallowing and voice change.   Eyes: Negative for photophobia, pain, discharge, redness, itching and visual disturbance.  Respiratory: Negative for cough, chest tightness and shortness of breath.   Cardiovascular: Positive for chest pain and palpitations (last week). Negative for leg swelling.  Gastrointestinal: Negative for blood in stool, constipation, diarrhea, nausea and vomiting.  Endocrine: Negative for polydipsia, polyphagia and polyuria.  Genitourinary: Negative for dysuria, frequency, hematuria and urgency.  Musculoskeletal: Negative for arthralgias, back pain, gait problem, joint swelling, myalgias, neck pain and neck stiffness.  Skin: Negative for rash.  Allergic/Immunologic: Negative for environmental allergies, food allergies and immunocompromised state.  Neurological: Positive for weakness (generalized), light-headedness ("off"), numbness ("pins and needles" sensation) and headaches (behind the LEFT eye). Negative for dizziness, tremors, seizures, syncope and facial asymmetry.  Hematological: Negative.   Psychiatric/Behavioral: Negative.      Prior to Admission  medications   Medication Sig Start Date End Date Taking? Authorizing Provider  ondansetron (ZOFRAN) 4 MG tablet Take 1 tablet (4 mg total) by mouth every 8 (eight) hours as needed for nausea or vomiting. Patient not taking: Reported on 07/26/2017 09/13/14   Mirian Mo, MD    phenazopyridine (PYRIDIUM) 95 MG tablet Take 95 mg by mouth 3 (three) times daily as needed for pain.    [provider]     No Known Allergies   Patient Active Problem List   Diagnosis Date Noted  . Irregular menses 12/28/2012  . 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 Diabetes Mother   . Arthritis Maternal Grandmother   . Heart disease Maternal Grandmother   . Cancer Paternal Grandmother        PANCREATIC  . Dementia Paternal Grandmother   . Hyperlipidemia Father   . Arthritis Maternal Aunt   . Cancer Maternal Aunt        BREAST  . Diabetes Maternal Aunt   . Cancer Maternal Grandfather        PROSTATE  . Cancer Paternal Grandfather        LUNG  . Cancer Maternal Aunt        BREAST  . Early death Maternal Aunt        DIED IN 81'S  . Diabetes Maternal Aunt   . Cancer Cousin        LYMPHOMA  . Thyroid disease Cousin   . Diabetes Brother      Social History   Social History  . Marital status: Married    Spouse name: CHRISTOPHER  . Number of children: 2  . Years of education: 16   Occupational History  . 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 Smoking status: Never Smoker  . Smokeless tobacco: Never Used  . Alcohol use Yes     Comment: social  . Drug use: No  . Sexual activity: Yes    Partners: Male    Birth control/ protection: Other-see comments     Comment: husband had vasectomy   Other Topics Concern  . 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 She appears well-developed and well-nourished. She is active and cooperative. No distress.  BP 107/72 (BP Location: Right Arm, Patient Position: Sitting, Cuff Size: Large)   Pulse 72   Temp 98.1 F (36.7 C) (Oral)   Resp 18   Ht 5\' 2"   (1.575 m)   Wt 190 lb 3.2 oz (86.3 kg)   LMP 07/04/2017   SpO2 97%   Breastfeeding? No   BMI 34.79 kg/m   HENT:  Head: Normocephalic and atraumatic.  Right Ear: Hearing normal.  Left Ear: Hearing normal.  Eyes: Conjunctivae are normal. No scleral icterus.  Neck: Normal range of motion. Neck supple. No thyromegaly present.  Cardiovascular: Normal rate, regular rhythm and normal heart sounds.   Pulses:      Radial pulses are 2+ on the right side, and 2+ on the left side.  Pulmonary/Chest: Effort normal and breath sounds normal.  Musculoskeletal: Normal range of motion.       Cervical back: Normal.       Thoracic back: Normal.  Lumbar back: Normal.  Lymphadenopathy:       Head (right side): No tonsillar, no preauricular, no posterior auricular and no occipital adenopathy present.       Head (left side): No tonsillar, no preauricular, no posterior auricular and no occipital adenopathy present.    She has no cervical adenopathy.       Right: No supraclavicular adenopathy present.       Left: No supraclavicular adenopathy present.  Neurological: She is alert and oriented to person, place, and time. She has normal strength. No cranial nerve deficit or sensory deficit. GCS eye subscore is 4. GCS verbal subscore is 5. GCS motor subscore is 6.  Reflex Scores:      Brachioradialis reflexes are 2+ on the right side and 2+ on the left side.      Patellar reflexes are 2+ on the right side and 2+ on the left side.      Achilles reflexes are 2+ on the right side and 2+ on the left side. Skin: Skin is warm, dry and intact. No rash noted. No cyanosis or erythema. Nails show no clubbing.  Psychiatric: She has a normal mood and affect. Her speech is normal and behavior is normal.    Results for orders placed or performed in visit on 07/26/17  POCT CBC  Result Value Ref Range   WBC 4.7 4.6 - 10.2 K/uL   Lymph, poc 1.8 0.6 - 3.4   POC LYMPH PERCENT 38.4 10 - 50 %L   MID (cbc) 0.3 0 - 0.9    POC MID % 5.8 0 - 12 %M   POC Granulocyte 2.6 2 - 6.9   Granulocyte percent 55.8 37 - 80 %G   RBC 4.32 4.04 - 5.48 M/uL   Hemoglobin 12.6 12.2 - 16.2 g/dL   HCT, POC 09.2 33.0 - 47.9 %   MCV 87.6 80 - 97 fL   MCH, POC 29.2 27 - 31.2 pg   MCHC 33.3 31.8 - 35.4 g/dL   RDW, POC 07.6 %   Platelet Count, POC 311 142 - 424 K/uL   MPV 7.0 0 - 99.8 fL  POCT glucose (manual entry)  Result Value Ref Range   POC Glucose 85 70 - 99 mg/dl  POCT glycosylated hemoglobin (Hb A1C)  Result Value Ref Range   Hemoglobin A1C 6.0   POCT urinalysis dipstick  Result Value Ref Range   Color, UA yellow yellow   Clarity, UA cloudy (A) clear   Glucose, UA negative negative mg/dL   Bilirubin, UA negative negative   Ketones, POC UA small (15) (A) negative mg/dL   Spec Grav, UA 2.263 3.354 - 1.025   Blood, UA negative negative   pH, UA 6.5 5.0 - 8.0   Protein Ur, POC negative negative mg/dL   Urobilinogen, UA 0.2 0.2 or 1.0 E.U./dL   Nitrite, UA Negative Negative   Leukocytes, UA Negative Negative    EKG reviewed with Dr. Neva Seat. NSR. Rate 65. PR 146. QT 388.     Assessment & Plan:  1. Paresthesia Unclear etiology. CT scan now. If negative, would refer to neurology for additional evaluation. - CT Head Wo Contrast; Future  2. Palpitations Occurred once, a week ago. Unclear etiology. Normal EKG here today. If recurs, would refer to cardiology. - EKG 12-Lead  3. Generalized weakness Unclear etiology. CT scan now. If negative, would refer to neurology for additional evaluation. - POCT CBC - POCT urinalysis dipstick - TSH - Comprehensive metabolic panel -  CT Head Wo Contrast; Future  4. Prediabetes Updated labs, as it has been >12 months and she has gained 20 lbs. Results are reassuring. - POCT glucose (manual entry) - POCT glycosylated hemoglobin (Hb A1C)  5. Pain, dental No evidence of systemic illness associated with this pain. Follow-up with her dentist. - POCT CBC    Return for  pending lab and CT results.   Fernande Bras, PA-C Primary Care at [facility]

## 2017-07-26 NOTE — Patient Instructions (Addendum)
  Go directly to [facility] ED. You have an appointment at the Radiology Department. Your appointment is scheduled for 7:30 pm.  IF you received an x-ray today, you will receive an invoice from [facility]. Please contact [facility] at [phone] with questions or concerns regarding your invoice.   IF you received labwork today, you will receive an invoice from [place]. Please contact [facility] at [phone] with questions or concerns regarding your invoice.   Our billing staff will not be able to assist you with questions regarding bills from these companies.  You will be contacted with the lab results as soon as they are available. The fastest way to get your results is to activate your My Chart account. Instructions are located on the last page of this paperwork. If you have not heard from [place] regarding the results in 2 weeks, please contact this office.

## 2017-07-27 LAB — COMPREHENSIVE METABOLIC PANEL
ALK PHOS: 51 IU/L (ref 39–117)
ALT: 16 IU/L (ref 0–32)
AST: 19 IU/L (ref 0–40)
Albumin/Globulin Ratio: 1.5 (ref 1.2–2.2)
Albumin: 4.4 g/dL (ref 3.5–5.5)
BILIRUBIN TOTAL: 0.2 mg/dL (ref 0.0–1.2)
BUN/Creatinine Ratio: 18 (ref 9–23)
BUN: 13 mg/dL (ref 6–24)
CO2: 25 mmol/L (ref 20–29)
Calcium: 9.7 mg/dL (ref 8.7–10.2)
Chloride: 100 mmol/L (ref 96–106)
Creatinine, Ser: 0.72 mg/dL (ref 0.57–1.00)
GFR calc Af Amer: 121 mL/min/{1.73_m2} (ref >59)
GFR calc non Af Amer: 105 mL/min/{1.73_m2} (ref >59)
GLUCOSE: 90 mg/dL (ref 65–99)
Globulin, Total: 2.9 g/dL (ref 1.5–4.5)
Potassium: 4.1 mmol/L (ref 3.5–5.2)
Sodium: 137 mmol/L (ref 134–144)
Total Protein: 7.3 g/dL (ref 6.0–8.5)

## 2017-07-27 LAB — TSH: TSH: 0.529 u[IU]/mL (ref 0.450–4.500)

## 2017-08-09 NOTE — Telephone Encounter (Signed)
error 

## 2017-11-02 ENCOUNTER — Ambulatory Visit (INDEPENDENT_AMBULATORY_CARE_PROVIDER_SITE_OTHER): Payer: BLUE CROSS/BLUE SHIELD | Admitting: Physician Assistant

## 2017-11-02 ENCOUNTER — Other Ambulatory Visit: Payer: Self-pay

## 2017-11-02 ENCOUNTER — Encounter: Payer: Self-pay | Admitting: Physician Assistant

## 2017-11-02 VITALS — BP 108/72 | HR 62 | Temp 98.0°F | Resp 18 | Ht 64.17 in | Wt 194.8 lb

## 2017-11-02 DIAGNOSIS — J029 Acute pharyngitis, unspecified: Secondary | ICD-10-CM | POA: Diagnosis not present

## 2017-11-02 DIAGNOSIS — H938X3 Other specified disorders of ear, bilateral: Secondary | ICD-10-CM

## 2017-11-02 DIAGNOSIS — H6993 Unspecified Eustachian tube disorder, bilateral: Secondary | ICD-10-CM

## 2017-11-02 LAB — POCT RAPID STREP A (OFFICE): RAPID STREP A SCREEN: NEGATIVE

## 2017-11-02 MED ORDER — PSEUDOEPHEDRINE HCL 60 MG PO TABS: 60.0000 mg | ORAL_TABLET | Freq: Two times a day (BID) | ORAL | 0 refills | Status: DC

## 2017-11-02 MED ORDER — FLUTICASONE PROPIONATE 50 MCG/ACT NA SUSP: 2.0000 | Freq: Every day | NASAL | 0 refills | Status: AC

## 2017-11-02 NOTE — Patient Instructions (Addendum)
Your likely experience eustachian tube dysfunction.  I recommend using an oral antihistamine, oral decongestant, and nasal steroid.  Since you have Zyrtec at home, you can use this daily.  I have also given you a prescription for Sudafed and Flonase to use.  Be aware that Sudafed can increase her heart rate and blood pressure.  It can also cause insomnia.  Take early in the day and see how it makes you feel.  In terms of your strep test, the rapid strep test was negative.  As mentioned in office, I will send off a culture and contact you with these results in 3-4 days.  Your symptoms should be improving over the next 3-5 days.  For now, I recommend using 600-800 mg of ibuprofen every 8 hours for sore throat.  You may also drink warm tea with ginger, honey, and limited to help soothe the throat.  Please return to clinic if symptoms worsen, do not improve with above treatment, or as needed.   Eustachian Tube Dysfunction The eustachian tube connects the middle ear to the back of the nose. It regulates air pressure in the middle ear by allowing air to move between the ear and nose. It also helps to drain fluid from the middle ear space. When the eustachian tube does not function properly, air pressure, fluid, or both can build up in the middle ear. Eustachian tube dysfunction can affect one or both ears. What are the causes? This condition happens when the eustachian tube becomes blocked or cannot open normally. This may result from:  Ear infections.  Colds and other upper respiratory infections.  Allergies.  Irritation, such as from cigarette smoke or acid from the stomach coming up into the esophagus (gastroesophageal reflux).  Sudden changes in air pressure, such as from descending in an airplane.  Abnormal growths in the nose or throat, such as nasal polyps, tumors, or enlarged tissue at the back of the throat (adenoids).  What increases the risk? This condition may be more likely to develop in  people who smoke and people who are overweight. Eustachian tube dysfunction may also be more likely to develop in children, especially children who have:  Certain birth defects of the mouth, such as cleft palate.  Large tonsils and adenoids.  What are the signs or symptoms? Symptoms of this condition may include:  A feeling of fullness in the ear.  Ear pain.  Clicking or popping noises in the ear.  Ringing in the ear.  Hearing loss.  Loss of balance.  Symptoms may get worse when the air pressure around you changes, such as when you travel to an area of high elevation or fly on an airplane. How is this diagnosed? This condition may be diagnosed based on:  Your symptoms.  A physical exam of your ear, nose, and throat.  Tests, such as those that measure: ? The movement of your eardrum (tympanogram). ? Your hearing (audiometry).  How is this treated? Treatment depends on the cause and severity of your condition. If your symptoms are mild, you may be able to relieve your symptoms by moving air into ("popping") your ears. If you have symptoms of fluid in your ears, treatment may include:  Decongestants.  Antihistamines.  Nasal sprays or ear drops that contain medicines that reduce swelling (steroids).  In some cases, you may need to have a procedure to drain the fluid in your eardrum (myringotomy). In this procedure, a small tube is placed in the eardrum to:  Drain  the fluid.  Restore the air in the middle ear space.  Follow these instructions at home:  Take over-the-counter and prescription medicines only as told by your health care provider.  Use techniques to help pop your ears as recommended by your health care provider. These may include: ? Chewing gum. ? Yawning. ? Frequent, forceful swallowing. ? Closing your mouth, holding your nose closed, and gently blowing as if you are trying to blow air out of your nose.  Do not do any of the following until your  health care provider approves: ? Travel to high altitudes. ? Fly in airplanes. ? Work in a Estate agent or room. ? Scuba dive.  Keep your ears dry. Dry your ears completely after showering or bathing.  Do not smoke.  Keep all follow-up visits as told by your health care provider. This is important. Contact a health care provider if:  Your symptoms do not go away after treatment.  Your symptoms come back after treatment.  You are unable to pop your ears.  You have: ? A fever. ? Pain in your ear. ? Pain in your head or neck. ? Fluid draining from your ear.  Your hearing suddenly changes.  You become very dizzy.  You lose your balance. This information is not intended to replace advice given to you by your health care provider. Make sure you discuss any questions you have with your health care provider. Document Released: 12/13/2015 Document Revised: 04/23/2016 Document Reviewed: 12/05/2014 Elsevier Interactive Patient Education  2018 [employer].   IF you received an x-ray today, you will receive an invoice from [facility]. Please contact [facility] Radiology at [phone] with questions or concerns regarding your invoice.   IF you received labwork today, you will receive an invoice from [place]. Please contact [facility] at [phone] with questions or concerns regarding your invoice.   Our billing staff will not be able to assist you with questions regarding bills from these companies.  You will be contacted with the lab results as soon as they are available. The fastest way to get your results is to activate your My Chart account. Instructions are located on the last page of this paperwork. If you have not heard from [place] regarding the results in 2 weeks, please contact this office.

## 2017-11-02 NOTE — Progress Notes (Signed)
MRN: [id number] DOB: 1976-01-03  Subjective:   Carla Sherman is a 41 y.o. female presenting for chief complaint of Ear Pain (X 2 days- headache) and Sore Throat (X 2 days- pt states her chest ache off and on) .  Reports history of bilateral ear ache x  2 days. Has associated mild pruritis in left ear, mild nasal congestion, and sore throat. Feels like she has a head cold.  Denies tinnitus, hearing loss,  sinus pain, rhinorrhea, wheezing, shortness of breath, chest tightness, chest pain and myalgia, chills, fatigue, nausea, vomiting, abdominal pain and diarrhea. Has tried zinc with moderate relief. Has  had  sick contact with husband and daughter. Daughter had strep two weeks ago. Has history of seasonal allergies, no history of asthma. Patient has not had flu shot this season. Denies smoking. Denies any other aggravating or relieving factors, no other questions or concerns.  Carla Sherman has a current medication list which includes the following prescription(s): fluticasone and pseudoephedrine. Also has No Known Allergies.  Carla Sherman  has a past medical history of Abnormal Pap smear, Infection, Pneumonia (2008), and Pregnancy induced hypertension. Also  has a past surgical history that includes Cesarean section ([name], 09/28/2013).   Objective:   Vitals: BP 108/72 (BP Location: Left Arm, Patient Position: Sitting, Cuff Size: Normal)   Pulse 62   Temp 98 F (36.7 C) (Oral)   Resp 18   Ht 5' 4.17" (1.63 m)   Wt 194 lb 12.8 oz (88.4 kg)   LMP 10/26/2017 (Approximate)   SpO2 99%   BMI 33.26 kg/m   Physical Exam  Constitutional: She is oriented to person, place, and time. She appears well-developed and well-nourished. She does not appear ill.  HENT:  Head: Normocephalic and atraumatic.  Right Ear: No tenderness. Tympanic membrane is not erythematous and not bulging. A middle ear effusion (mild) is present.  Left Ear: No tenderness. Tympanic membrane is not erythematous and not  bulging. A middle ear effusion (mild) is present.  Nose: Mucosal edema (mild mucosal edema) present. No rhinorrhea. Right sinus exhibits no maxillary sinus tenderness and no frontal sinus tenderness. Left sinus exhibits no maxillary sinus tenderness and no frontal sinus tenderness.  Mouth/Throat: Uvula is midline and mucous membranes are normal. Posterior oropharyngeal erythema present. Tonsils are 2+ on the right. Tonsils are 2+ on the left. No tonsillar exudate.  Eyes: Conjunctivae and EOM are normal. Pupils are equal, round, and reactive to light.  Neck: Normal range of motion.  Cardiovascular: Normal rate, regular rhythm and normal heart sounds.  Pulmonary/Chest: Effort normal and breath sounds normal. She has no wheezes. She has no rhonchi. She has no rales.  Lymphadenopathy:       Head (right side): No submental, no submandibular, no tonsillar, no preauricular, no posterior auricular and no occipital adenopathy present.       Head (left side): No submental, no submandibular, no tonsillar, no preauricular, no posterior auricular and no occipital adenopathy present.    She has cervical adenopathy.       Right cervical: No superficial cervical, no deep cervical and no posterior cervical adenopathy present.      Left cervical: Superficial cervical adenopathy present. No deep cervical and no posterior cervical adenopathy present.       Right: No supraclavicular adenopathy present.       Left: No supraclavicular adenopathy present.  Neurological: She is alert and oriented to person, place, and time.  Skin: Skin is warm and dry.  Psychiatric: She  has a normal mood and affect.  Vitals reviewed.   Results for orders placed or performed in visit on 11/02/17 (from the past 24 hour(s))  POCT rapid strep A     Status: Normal   Collection Time: 11/02/17 10:07 AM  Result Value Ref Range   Rapid Strep A Screen Negative Negative    Assessment and Plan :  1. Sore throat Rapid strep test negative.   Culture pending.  She is afebrile.  No exudates noted on tonsils.  Symptoms have only been present for 2 days.  Will treat symptomatically at this time.  Recommended 600-800 mg ibuprofen every 8 hours for pain and warm tea with ginger, lemon, and honey. - POCT rapid strep A - Culture, Group A Strep 2. Ear fullness, bilateral 3. Disorder of both eustachian tubes History and physical exam findings consistent with eustachian tube disorder.  Likely due to viral head cold.  Will treat with oral antihistamine, oral decongestant, nasal steroid. - pseudoephedrine (SUDAFED) 60 MG tablet; Take 1 tablet (60 mg total) by mouth every 12 (twelve) hours.  Dispense: 20 tablet; Refill: 0 - fluticasone (FLONASE) 50 MCG/ACT nasal spray; Place 2 sprays into both nostrils daily.  Dispense: 16 g; Refill: 0  Patient advised to return to clinic if symptoms worsen, do not improve with treatment above, or as needed.  Benjiman CoreBrittany Deval Mroczka, PA-C  Primary Care at [facility] 11/02/2017 10:48 AM

## 2017-11-04 LAB — CULTURE, GROUP A STREP: STREP A CULTURE: NEGATIVE

## 2017-11-29 ENCOUNTER — Other Ambulatory Visit: Payer: Self-pay | Admitting: Physician Assistant

## 2017-11-29 DIAGNOSIS — H6993 Unspecified Eustachian tube disorder, bilateral: Secondary | ICD-10-CM

## 2018-01-28 ENCOUNTER — Encounter: Payer: Self-pay | Admitting: Urgent Care

## 2018-01-28 ENCOUNTER — Ambulatory Visit (INDEPENDENT_AMBULATORY_CARE_PROVIDER_SITE_OTHER): Payer: BLUE CROSS/BLUE SHIELD | Admitting: Urgent Care

## 2018-01-28 ENCOUNTER — Ambulatory Visit (INDEPENDENT_AMBULATORY_CARE_PROVIDER_SITE_OTHER): Payer: BLUE CROSS/BLUE SHIELD

## 2018-01-28 VITALS — BP 108/75 | HR 77 | Temp 98.0°F | Resp 18 | Ht 64.0 in | Wt 192.2 lb

## 2018-01-28 DIAGNOSIS — R8290 Unspecified abnormal findings in urine: Secondary | ICD-10-CM | POA: Diagnosis not present

## 2018-01-28 DIAGNOSIS — R1084 Generalized abdominal pain: Secondary | ICD-10-CM | POA: Diagnosis not present

## 2018-01-28 DIAGNOSIS — R109 Unspecified abdominal pain: Secondary | ICD-10-CM

## 2018-01-28 DIAGNOSIS — K5900 Constipation, unspecified: Secondary | ICD-10-CM

## 2018-01-28 LAB — POCT URINALYSIS DIP (MANUAL ENTRY)
BILIRUBIN UA: NEGATIVE
BILIRUBIN UA: NEGATIVE mg/dL
Glucose, UA: NEGATIVE mg/dL
Leukocytes, UA: NEGATIVE
Nitrite, UA: NEGATIVE
PH UA: 8.5 — AB (ref 5.0–8.0)
Protein Ur, POC: 30 mg/dL — AB
RBC UA: NEGATIVE
SPEC GRAV UA: 1.015 (ref 1.010–1.025)
Urobilinogen, UA: 1 E.U./dL

## 2018-01-28 LAB — POCT URINE PREGNANCY: PREG TEST UR: NEGATIVE

## 2018-01-28 MED ORDER — DOCUSATE SODIUM 50 MG PO CAPS: 50.0000 mg | ORAL_CAPSULE | Freq: Two times a day (BID) | ORAL | 1 refills | Status: AC

## 2018-01-28 NOTE — Patient Instructions (Addendum)
Please use Miralax for moderate to severe constipation. Take this once a day for the next 2-3 days. Please also start docusate stool softener, twice a day for at least 1 week. If stools become loose, cut down to once a day for another week. If stools remain loose, cut back to 1 pill every other day for a third week. You can stop docusate thereafter and resume as needed for constipation.  To help reduce constipation and promote bowel health: 1. Drink at least 64 ounces of water each day 2. Eat plenty of fiber (fruits, vegetables, whole grains, legumes) 3. Be physically active or exercise including walking, jogging, swimming, yoga, etc. 4. For active constipation use a stool softener (docusate) or an osmotic laxative (like Miralax) each day, or as needed.      Abdominal Pain, Adult Abdominal pain can be caused by many things. Often, abdominal pain is not serious and it gets better with no treatment or by being treated at home. However, sometimes abdominal pain is serious. Your health care provider will do a medical history and a physical exam to try to determine the cause of your abdominal pain. Follow these instructions at home:  Take over-the-counter and prescription medicines only as told by your health care provider. Do not take a laxative unless told by your health care provider.  Drink enough fluid to keep your urine clear or pale yellow.  Watch your condition for any changes.  Keep all follow-up visits as told by your health care provider. This is important. Contact a health care provider if:  Your abdominal pain changes or gets worse.  You are not hungry or you lose weight without trying.  You are constipated or have diarrhea for more than 2-3 days.  You have pain when you urinate or have a bowel movement.  Your abdominal pain wakes you up at night.  Your pain gets worse with meals, after eating, or with certain foods.  You are throwing up and cannot keep anything  down.  You have a fever. Get help right away if:  Your pain does not go away as soon as your health care provider told you to expect.  You cannot stop throwing up.  Your pain is only in areas of the abdomen, such as the right side or the left lower portion of the abdomen.  You have bloody or black stools, or stools that look like tar.  You have severe pain, cramping, or bloating in your abdomen.  You have signs of dehydration, such as: ? Dark urine, very little urine, or no urine. ? Cracked lips. ? Dry mouth. ? Sunken eyes. ? Sleepiness. ? Weakness. This information is not intended to replace advice given to you by your health care provider. Make sure you discuss any questions you have with your health care provider. Document Released: 08/26/2005 Document Revised: 06/05/2016 Document Reviewed: 04/29/2016 Elsevier Interactive Patient Education  2018 [employer]     Constipation, Adult Constipation is when a person has fewer bowel movements in a week than normal, has difficulty having a bowel movement, or has stools that are dry, hard, or larger than normal. Constipation may be caused by an underlying condition. It may become worse with age if a person takes certain medicines and does not take in enough fluids. Follow these instructions at home: Eating and drinking   Eat foods that have a lot of fiber, such as fresh fruits and vegetables, whole grains, and beans.  Limit foods that are high in  fat, low in fiber, or overly processed, such as french fries, hamburgers, cookies, candies, and soda.  Drink enough fluid to keep your urine clear or pale yellow. General instructions  Exercise regularly or as told by your health care provider.  Go to the restroom when you have the urge to go. Do not hold it in.  Take over-the-counter and prescription medicines only as told by your health care provider. These include any fiber supplements.  Practice pelvic floor retraining  exercises, such as deep breathing while relaxing the lower abdomen and pelvic floor relaxation during bowel movements.  Watch your condition for any changes.  Keep all follow-up visits as told by your health care provider. This is important. Contact a health care provider if:  You have pain that gets worse.  You have a fever.  You do not have a bowel movement after 4 days.  You vomit.  You are not hungry.  You lose weight.  You are bleeding from the anus.  You have thin, pencil-like stools. Get help right away if:  You have a fever and your symptoms suddenly get worse.  You leak stool or have blood in your stool.  Your abdomen is bloated.  You have severe pain in your abdomen.  You feel dizzy or you faint. This information is not intended to replace advice given to you by your health care provider. Make sure you discuss any questions you have with your health care provider. Document Released: 08/14/2004 Document Revised: 06/05/2016 Document Reviewed: 05/06/2016 Elsevier Interactive Patient Education  2018 [employer].     IF you received an x-ray today, you will receive an invoice from [facility]. Please contact [facility] at [phone] with questions or concerns regarding your invoice.   IF you received labwork today, you will receive an invoice from [facility]. Please contact [facility] at [phone] with questions or concerns regarding your invoice.   Our billing staff will not be able to assist you with questions regarding bills from these companies.  You will be contacted with the lab results as soon as they are available. The fastest way to get your results is to activate your My Chart account. Instructions are located on the last page of this paperwork. If you have not heard from [place] regarding the results in 2 weeks, please contact this office.

## 2018-01-28 NOTE — Progress Notes (Signed)
MRN: [id number] DOB: 1976-06-06  Subjective:   Carla Sherman is a 42 y.o. female presenting for 1 week history of left flank pain after eating. Pain radiates anteriorly to left abdomen toward right side. Pain is like a throbbing sensation. Patient has longstanding difficulty with constipation, relieved with herbal tea. Has hard stools. Last bowel movement was today, had a few pellets but then a few hours later had a full bowel movement. Has also had subjective fever, nausea without vomiting. Patient is having headaches, body aches, blisters, pimples. Denies bloody stools, dysuria, hematuria. Denies history of smoking.    Carla Sherman has a current medication list which includes the following prescription(s): fluticasone. Also has No Known Allergies.  Carla Sherman  has a past medical history of Abnormal Pap smear, Infection, Pneumonia (2008), and Pregnancy induced hypertension. Also  has a past surgical history that includes Cesarean section ([name], 09/28/2013). Her family history includes Arthritis in her maternal aunt and maternal grandmother; Cancer in her cousin, maternal aunt, maternal aunt, maternal grandfather, paternal grandfather, and paternal grandmother; Dementia in her paternal grandmother; Diabetes in her brother, maternal aunt, maternal aunt, and mother; Early death in her maternal aunt; Heart disease in her maternal grandmother; Hyperlipidemia in her father; Thyroid disease in her cousin.   Objective:   Vitals: BP 108/75   Pulse 77   Temp 98 F (36.7 C) (Oral)   Resp 18   Ht 5\' 4"  (1.626 m)   Wt 192 lb 3.2 oz (87.2 kg)   SpO2 98%   BMI 32.99 kg/m   Physical Exam  Constitutional: She is oriented to person, place, and time. She appears well-developed and well-nourished.  HENT:  Mouth/Throat: Oropharynx is clear and moist.  Eyes: No scleral icterus.  Cardiovascular: Normal rate, regular rhythm and intact distal pulses. Exam reveals no gallop and no friction rub.  No murmur  heard. Pulmonary/Chest: No respiratory distress. She has no wheezes. She has no rales.  Abdominal: Soft. Bowel sounds are normal. She exhibits no distension and no mass. There is no tenderness. There is no guarding.  Musculoskeletal: She exhibits no edema.  Neurological: She is alert and oriented to person, place, and time.  Skin: Skin is warm and dry. No rash noted. No erythema. No pallor.  Psychiatric:  Flat affect, appeared overwhelmed when discussing her long-standing difficulties with constipation.   Results for orders placed or performed in visit on 01/28/18 (from the past 24 hour(s))  POCT urine pregnancy     Status: None   Collection Time: 01/28/18  5:04 PM  Result Value Ref Range   Preg Test, Ur Negative Negative  POCT urinalysis dipstick     Status: Abnormal   Collection Time: 01/28/18  5:09 PM  Result Value Ref Range   Color, UA yellow yellow   Clarity, UA clear clear   Glucose, UA negative negative mg/dL   Bilirubin, UA negative negative   Ketones, POC UA negative negative mg/dL   Spec Grav, UA 4.098 1.191 - 1.025   Blood, UA negative negative   pH, UA 8.5 (A) 5.0 - 8.0   Protein Ur, POC =30 (A) negative mg/dL   Urobilinogen, UA 1.0 0.2 or 1.0 E.U./dL   Nitrite, UA Negative Negative   Leukocytes, UA Negative Negative    Dg Abd 1 View  Result Date: 01/28/2018 CLINICAL DATA:  Abdominal pain EXAM: ABDOMEN - 1 VIEW COMPARISON:  CT abdomen and pelvis 02/10/2017 FINDINGS: Bowel gas pattern normal. No bowel dilatation or bowel wall thickening. Osseous structures  unremarkable. No pathologic calcifications. Visualized LEFT lung base clear. IMPRESSION: Normal exam. Electronically Signed   By: Ulyses SouthwardMark  Boles M.D.   On: 01/28/2018 17:13    Assessment and Plan :   Generalized abdominal pain - Plan: DG Abd 1 View, Comprehensive metabolic panel, Lipase, CBC, Ambulatory referral to Gastroenterology, CANCELED: H. pylori breath test  Left flank pain - Plan: Comprehensive metabolic panel,  POCT urine pregnancy, POCT urinalysis dipstick, Ambulatory referral to Gastroenterology  Constipation, unspecified constipation type - Plan: Ambulatory referral to Gastroenterology  Abnormal urinalysis - Plan: Urine Microscopic  Referral to GI pending. Point of care labs, x-ray reassuring. Discussed management of constipation.   Wallis BambergMario Yonna Alwin, PA-C [facility] [phone] 01/28/2018  4:43 PM

## 2018-01-29 LAB — COMPREHENSIVE METABOLIC PANEL
A/G RATIO: 1.4 (ref 1.2–2.2)
ALK PHOS: 54 IU/L (ref 39–117)
ALT: 14 IU/L (ref 0–32)
AST: 14 IU/L (ref 0–40)
Albumin: 4.2 g/dL (ref 3.5–5.5)
BUN/Creatinine Ratio: 15 (ref 9–23)
BUN: 12 mg/dL (ref 6–24)
Bilirubin Total: <0.2 mg/dL (ref 0.0–1.2)
CO2: 25 mmol/L (ref 20–29)
Calcium: 8.9 mg/dL (ref 8.7–10.2)
Chloride: 102 mmol/L (ref 96–106)
Creatinine, Ser: 0.8 mg/dL (ref 0.57–1.00)
GFR calc Af Amer: 107 mL/min/{1.73_m2} (ref >59)
GFR calc non Af Amer: 93 mL/min/{1.73_m2} (ref >59)
GLOBULIN, TOTAL: 2.9 g/dL (ref 1.5–4.5)
Glucose: 82 mg/dL (ref 65–99)
POTASSIUM: 4.5 mmol/L (ref 3.5–5.2)
SODIUM: 138 mmol/L (ref 134–144)
Total Protein: 7.1 g/dL (ref 6.0–8.5)

## 2018-01-29 LAB — LIPASE: Lipase: 34 U/L (ref 14–72)

## 2018-01-29 LAB — CBC
Hematocrit: 35.7 % (ref 34.0–46.6)
Hemoglobin: 12 g/dL (ref 11.1–15.9)
MCH: 29.1 pg (ref 26.6–33.0)
MCHC: 33.6 g/dL (ref 31.5–35.7)
MCV: 86 fL (ref 79–97)
PLATELETS: 325 10*3/uL (ref 150–379)
RBC: 4.13 x10E6/uL (ref 3.77–5.28)
RDW: 13.2 % (ref 12.3–15.4)
WBC: 5.6 10*3/uL (ref 3.4–10.8)

## 2018-01-29 LAB — URINALYSIS, MICROSCOPIC ONLY
Bacteria, UA: NONE SEEN
Casts: NONE SEEN /lpf

## 2018-02-02 ENCOUNTER — Ambulatory Visit: Payer: Self-pay | Admitting: *Deleted

## 2018-02-02 ENCOUNTER — Telehealth: Payer: Self-pay | Admitting: Urgent Care

## 2018-02-02 NOTE — Telephone Encounter (Signed)
Seen by Carla Bamberg, PA last Friday for c/o left lower flank and abd pain.   An x-ray was done and blood work drawn.   She does not know the results yet.   She said the x-ray was normal.   Don't know blood work results yet.   She said the day after seeing Carla Sherman she got sick with the flu.   Her whole family has been sick this past week. When I pulled up [facility] office after visit summary I asked her if she was taking the Miralax or taking the stool softeners Carla Sherman had recommended for her constipation.   She said no.  "I think those papers are in my truck".    I went over the discharge instructions with her that Carla Sherman had put in her AVS.   She verbalized understanding.   I also let her know that a GI consult was pending depending on what her lab results revealed and that she should be hearing something today or tomorrow at the latest regarding her lab results.     She agreed to the plan of following Mario's instructions and seeing what he recommends after reviewing her lab results.  I instructed her to call us back if her pain becomes worse or her symptoms change.  Reason for Disposition . Back pain . Mild constipation  Answer Assessment - Initial Assessment Questions 1. ONSET: "When did the pain begin?"      Been having this pain on and off for 2 years.   Was seen by Carla Sherman at [facility] [place] last Friday.    He has ordered lab work and an Museum/gallery curator.   She has not heard the results yet.   Should hear today. 2. LOCATION: "Where does it hurt?" (upper, mid or lower back)     Left lower back area and under rib cage on left side.   She had her kidneys checked a while back and "they were normal".    She asked me if she had pancreas problems or appendicitis.   I let her know I did not know.   3. SEVERITY: "How bad is the pain?"  (e.g., Scale 1-10; mild, moderate, or severe)   - MILD (1-3): doesn't interfere with normal activities    - MODERATE (4-7): interferes with normal activities or awakens  from sleep    - SEVERE (8-10): excruciating pain, unable to do any normal activities     She was seen by Carla Sherman and being treated for constipation. 4. PATTERN: "Is the pain constant?" (e.g., yes, no; constant, intermittent)      Intermittent 5. RADIATION: "Does the pain shoot into your legs or elsewhere?"     Under left rib cage and left lower flank area. 6. CAUSE:  "What do you think is causing the back pain?"      I don't know. 7. BACK OVERUSE:  "Any recent lifting of heavy objects, strenuous work or exercise?"     No 8. MEDICATIONS: "What have you taken so far for the pain?" (e.g., nothing, acetaminophen, NSAIDS)     I asked her if she was doing the Miralax that was prescribed for her by Carla Sherman.   She answered no.   She said she got sick with the flu the next day and has not done any of the instructions that were given to her including any of the medications. 9. NEUROLOGIC SYMPTOMS: "Do you have any weakness, numbness, or problems with bowel/bladder control?"  Not asked 10. OTHER SYMPTOMS: "Do you have any other symptoms?" (e.g., fever, abdominal pain, burning with urination, blood in urine)        11. PREGNANCY: "Is there any chance you are pregnant?" (e.g., yes, no; LMP)       *No Answer*  Answer Assessment - Initial Assessment Questions 1. STOOL PATTERN OR FREQUENCY: "How often do you pass bowel movements (BMs)?"  (Normal range: tid to q 3 days)  "When was the last BM passed?"       See triage notes for flank pain that was done.   Carla Sherman, [place] is treating her for these symptoms after seeing her Friday for constipation.   She has not been following the instructions because "she has been sick with the flu".  2. STRAINING: "Do you have to strain to have a BM?"      *No Answer* 3. RECTAL PAIN: "Does your rectum hurt when the stool comes out?" If so, ask: "Do you have hemorrhoids? How bad is the pain?"  (Scale 1-10; or mild, moderate, severe)     *No Answer* 4. STOOL  COMPOSITION: "Are the stools hard?"      *No Answer* 5. BLOOD ON STOOLS: "Has there been any blood on the toilet tissue or on the surface of the BM?" If so, ask: "When was the last time?"      *No Answer* 6. CHRONIC CONSTIPATION: "Is this a new problem for you?"  If no, ask: How long have you had this problem?" (days, weeks, months)      No.   Been going on and off for 2 years. 7. CHANGES IN DIET: "Have there been any recent changes in your diet?"      *No Answer* 8. MEDICATIONS: "Have you been taking any new medications?"     *No Answer* 9. LAXATIVES: "Have you been using any laxatives or enemas?"  If yes, ask "What, how often, and when was the last time?"     No.   Carla Bamberg, PA ordered these for her but she is not taking them because "I've been sick with the flu for the past week". 10. CAUSE: "What do you think is causing the constipation?"        I don't know.   Asked me if she had pancreas problems or appendicitis.  I let her know I did not know.    11. OTHER SYMPTOMS: "Do you have any other symptoms?" (e.g., abdominal pain, fever, vomiting)       Denies fever. 12. PREGNANCY: "Is there any chance you are pregnant?" "When was your last menstrual period?"       Not asked  Protocols used: BACK PAIN-A-AH, CONSTIPATION-A-AH

## 2018-02-02 NOTE — Telephone Encounter (Signed)
Copied from CRM [phone]. Topic: Quick Communication - See Telephone Encounter >> Feb 02, 2018 11:00 AM Eston Mouldavis, Cianni Manny B wrote: CRM for notification. See Telephone encounter for:  PT is requesting results of lab work  02/02/18.

## 2018-02-03 NOTE — Telephone Encounter (Signed)
Duplicate encounter

## 2018-02-03 NOTE — Telephone Encounter (Signed)
Provider, please advise regarding results.

## 2018-02-25 DIAGNOSIS — H01022 Squamous blepharitis right lower eyelid: Secondary | ICD-10-CM | POA: Diagnosis not present

## 2018-02-25 DIAGNOSIS — H01025 Squamous blepharitis left lower eyelid: Secondary | ICD-10-CM | POA: Diagnosis not present

## 2018-02-25 DIAGNOSIS — H01024 Squamous blepharitis left upper eyelid: Secondary | ICD-10-CM | POA: Diagnosis not present

## 2018-02-25 DIAGNOSIS — H01021 Squamous blepharitis right upper eyelid: Secondary | ICD-10-CM | POA: Diagnosis not present

## 2018-05-10 DIAGNOSIS — K5909 Other constipation: Secondary | ICD-10-CM | POA: Diagnosis not present

## 2018-07-22 ENCOUNTER — Other Ambulatory Visit: Payer: Self-pay | Admitting: Physician Assistant

## 2018-07-22 DIAGNOSIS — H6993 Unspecified Eustachian tube disorder, bilateral: Secondary | ICD-10-CM

## 2018-07-22 NOTE — Telephone Encounter (Signed)
Attempted to call patient verify if she needs Flonase. Unable to leave message on phone.

## 2018-07-22 NOTE — Telephone Encounter (Signed)
Patient called, unable to leave message due to mailbox full.

## 2018-08-10 DIAGNOSIS — K5900 Constipation, unspecified: Secondary | ICD-10-CM | POA: Diagnosis not present

## 2018-08-16 ENCOUNTER — Telehealth: Payer: Self-pay | Admitting: Family Medicine

## 2018-08-16 NOTE — Telephone Encounter (Signed)
Left a VM in regards to her appt she has with Dr. Creta LevinStallings on 08/17/2018. The providers are having a meeting during that time and would need to be rescheduled.

## 2018-08-17 ENCOUNTER — Encounter: Payer: BLUE CROSS/BLUE SHIELD | Admitting: Family Medicine

## 2018-08-23 ENCOUNTER — Ambulatory Visit (INDEPENDENT_AMBULATORY_CARE_PROVIDER_SITE_OTHER): Payer: BLUE CROSS/BLUE SHIELD | Admitting: Emergency Medicine

## 2018-08-23 ENCOUNTER — Encounter: Payer: Self-pay | Admitting: Emergency Medicine

## 2018-08-23 ENCOUNTER — Other Ambulatory Visit: Payer: Self-pay

## 2018-08-23 VITALS — BP 93/63 | HR 74 | Temp 99.3°F | Resp 16 | Ht 62.25 in | Wt 191.0 lb

## 2018-08-23 DIAGNOSIS — Z1321 Encounter for screening for nutritional disorder: Secondary | ICD-10-CM

## 2018-08-23 DIAGNOSIS — Z0000 Encounter for general adult medical examination without abnormal findings: Secondary | ICD-10-CM | POA: Diagnosis not present

## 2018-08-23 DIAGNOSIS — Z13228 Encounter for screening for other metabolic disorders: Secondary | ICD-10-CM

## 2018-08-23 DIAGNOSIS — Z1329 Encounter for screening for other suspected endocrine disorder: Secondary | ICD-10-CM

## 2018-08-23 DIAGNOSIS — Z130 Encounter for screening for diseases of the blood and blood-forming organs and certain disorders involving the immune mechanism: Secondary | ICD-10-CM

## 2018-08-23 LAB — POCT URINALYSIS DIP (MANUAL ENTRY)
BILIRUBIN UA: NEGATIVE
BILIRUBIN UA: NEGATIVE mg/dL
Blood, UA: NEGATIVE
Glucose, UA: NEGATIVE mg/dL
Nitrite, UA: NEGATIVE
PH UA: 6 (ref 5.0–8.0)
PROTEIN UA: NEGATIVE mg/dL
SPEC GRAV UA: 1.01 (ref 1.010–1.025)
Urobilinogen, UA: 0.2 E.U./dL

## 2018-08-23 NOTE — Patient Instructions (Addendum)
If you have lab work done today you will be contacted with your lab results within the next 2 weeks.  If you have not heard from [place] then please contact us. The fastest way to get your results is to register for My Chart.   IF you received an x-ray today, you will receive an invoice from [facility]. Please contact [facility] at [phone] with questions or concerns regarding your invoice.   IF you received labwork today, you will receive an invoice from [place]. Please contact [facility] at [phone] with questions or concerns regarding your invoice.   Our billing staff will not be able to assist you with questions regarding bills from these companies.  You will be contacted with the lab results as soon as they are available. The fastest way to get your results is to activate your My Chart account. Instructions are located on the last page of this paperwork. If you have not heard from [place] regarding the results in 2 weeks, please contact this office.     Health Maintenance, Female Adopting a healthy lifestyle and getting preventive care can go a long way to promote health and wellness. Talk with your health care provider about what schedule of regular examinations is right for you. This is a good chance for you to check in with your provider about disease prevention and staying healthy. In between checkups, there are plenty of things you can do on your own. Experts have done a lot of research about which lifestyle changes and preventive measures are most likely to keep you healthy. Ask your health care provider for more information. Weight and diet Eat a healthy diet  Be sure to include plenty of vegetables, fruits, low-fat dairy products, and lean protein.  Do not eat a lot of foods high in solid fats, added sugars, or salt.  Get regular exercise. This is one of the most important things you can do for your health. ? Most adults should exercise for at least 150  minutes each week. The exercise should increase your heart rate and make you sweat (moderate-intensity exercise). ? Most adults should also do strengthening exercises at least twice a week. This is in addition to the moderate-intensity exercise.  Maintain a healthy weight  Body mass index (BMI) is a measurement that can be used to identify possible weight problems. It estimates body fat based on height and weight. Your health care provider can help determine your BMI and help you achieve or maintain a healthy weight.  For females 22 years of age and older: ? A BMI below 18.5 is considered underweight. ? A BMI of 18.5 to 24.9 is normal. ? A BMI of 25 to 29.9 is considered overweight. ? A BMI of 30 and above is considered obese.  Watch levels of cholesterol and blood lipids  You should start having your blood tested for lipids and cholesterol at 41 years of age, then have this test every 5 years.  You may need to have your cholesterol levels checked more often if: ? Your lipid or cholesterol levels are high. ? You are older than 41 years of age. ? You are at high risk for heart disease.  Cancer screening Lung Cancer  Lung cancer screening is recommended for adults 58-34 years old who are at high risk for lung cancer because of a history of smoking.  A yearly low-dose CT scan of the lungs is recommended for people who: ? Currently smoke. ? Have quit  within the past 15 years. ? Have at least a 30-pack-year history of smoking. A pack year is smoking an average of one pack of cigarettes a day for 1 year.  Yearly screening should continue until it has been 15 years since you quit.  Yearly screening should stop if you develop a health problem that would prevent you from having lung cancer treatment.  Breast Cancer  Practice breast self-awareness. This means understanding how your breasts normally appear and feel.  It also means doing regular breast self-exams. Let your health care  provider know about any changes, no matter how small.  If you are in your 20s or 30s, you should have a clinical breast exam (CBE) by a health care provider every 1-3 years as part of a regular health exam.  If you are 48 or older, have a CBE every year. Also consider having a breast X-ray (mammogram) every year.  If you have a family history of breast cancer, talk to your health care provider about genetic screening.  If you are at high risk for breast cancer, talk to your health care provider about having an MRI and a mammogram every year.  Breast cancer gene (BRCA) assessment is recommended for women who have family members with BRCA-related cancers. BRCA-related cancers include: ? Breast. ? Ovarian. ? Tubal. ? Peritoneal cancers.  Results of the assessment will determine the need for genetic counseling and BRCA1 and BRCA2 testing.  Cervical Cancer Your health care provider may recommend that you be screened regularly for cancer of the pelvic organs (ovaries, uterus, and vagina). This screening involves a pelvic examination, including checking for microscopic changes to the surface of your cervix (Pap test). You may be encouraged to have this screening done every 3 years, beginning at age 8.  For women ages 8-65, health care providers may recommend pelvic exams and Pap testing every 3 years, or they may recommend the Pap and pelvic exam, combined with testing for human papilloma virus (HPV), every 5 years. Some types of HPV increase your risk of cervical cancer. Testing for HPV may also be done on women of any age with unclear Pap test results.  Other health care providers may not recommend any screening for nonpregnant women who are considered low risk for pelvic cancer and who do not have symptoms. Ask your health care provider if a screening pelvic exam is right for you.  If you have had past treatment for cervical cancer or a condition that could lead to cancer, you need Pap tests  and screening for cancer for at least 20 years after your treatment. If Pap tests have been discontinued, your risk factors (such as having a new sexual partner) need to be reassessed to determine if screening should resume. Some women have medical problems that increase the chance of getting cervical cancer. In these cases, your health care provider may recommend more frequent screening and Pap tests.  Colorectal Cancer  This type of cancer can be detected and often prevented.  Routine colorectal cancer screening usually begins at 41 years of age and continues through 41 years of age.  Your health care provider may recommend screening at an earlier age if you have risk factors for colon cancer.  Your health care provider may also recommend using home test kits to check for hidden blood in the stool.  A small camera at the end of a tube can be used to examine your colon directly (sigmoidoscopy or colonoscopy). This is done to check  for the earliest forms of colorectal cancer.  Routine screening usually begins at age 75.  Direct examination of the colon should be repeated every 5-10 years through 41 years of age. However, you may need to be screened more often if early forms of precancerous polyps or small growths are found.  Skin Cancer  Check your skin from head to toe regularly.  Tell your health care provider about any new moles or changes in moles, especially if there is a change in a mole's shape or color.  Also tell your health care provider if you have a mole that is larger than the size of a pencil eraser.  Always use sunscreen. Apply sunscreen liberally and repeatedly throughout the day.  Protect yourself by wearing long sleeves, pants, a wide-brimmed hat, and sunglasses whenever you are outside.  Heart disease, diabetes, and high blood pressure  High blood pressure causes heart disease and increases the risk of stroke. High blood pressure is more likely to develop  in: ? People who have blood pressure in the high end of the normal range (130-139/85-89 mm Hg). ? People who are overweight or obese. ? People who are African American.  If you are 41-67 years of age, have your blood pressure checked every 3-5 years. If you are 32 years of age or older, have your blood pressure checked every year. You should have your blood pressure measured twice-once when you are at a hospital or clinic, and once when you are not at a hospital or clinic. Record the average of the two measurements. To check your blood pressure when you are not at a hospital or clinic, you can use: ? An automated blood pressure machine at a pharmacy. ? A home blood pressure monitor.  If you are between 44 years and 70 years old, ask your health care provider if you should take aspirin to prevent strokes.  Have regular diabetes screenings. This involves taking a blood sample to check your fasting blood sugar level. ? If you are at a normal weight and have a low risk for diabetes, have this test once every three years after 41 years of age. ? If you are overweight and have a high risk for diabetes, consider being tested at a younger age or more often. Preventing infection Hepatitis B  If you have a higher risk for hepatitis B, you should be screened for this virus. You are considered at high risk for hepatitis B if: ? You were born in a country where hepatitis B is common. Ask your health care provider which countries are considered high risk. ? Your parents were born in a high-risk country, and you have not been immunized against hepatitis B (hepatitis B vaccine). ? You have HIV or AIDS. ? You use needles to inject street drugs. ? You live with someone who has hepatitis B. ? You have had sex with someone who has hepatitis B. ? You get hemodialysis treatment. ? You take certain medicines for conditions, including cancer, organ transplantation, and autoimmune conditions.  Hepatitis C  Blood  testing is recommended for: ? Everyone born from [birth year] through 1964. ? Anyone with known risk factors for hepatitis C.  Sexually transmitted infections (STIs)  You should be screened for sexually transmitted infections (STIs) including gonorrhea and chlamydia if: ? You are sexually active and are younger than 41 years of age. ? You are older than 41 years of age and your health care provider tells you that you are at risk for  this type of infection. ? Your sexual activity has changed since you were last screened and you are at an increased risk for chlamydia or gonorrhea. Ask your health care provider if you are at risk.  If you do not have HIV, but are at risk, it may be recommended that you take a prescription medicine daily to prevent HIV infection. This is called pre-exposure prophylaxis (PrEP). You are considered at risk if: ? You are sexually active and do not regularly use condoms or know the HIV status of your partner(s). ? You take drugs by injection. ? You are sexually active with a partner who has HIV.  Talk with your health care provider about whether you are at high risk of being infected with HIV. If you choose to begin PrEP, you should first be tested for HIV. You should then be tested every 3 months for as long as you are taking PrEP. Pregnancy  If you are premenopausal and you may become pregnant, ask your health care provider about preconception counseling.  If you may become pregnant, take 400 to 800 micrograms (mcg) of folic acid every day.  If you want to prevent pregnancy, talk to your health care provider about birth control (contraception). Osteoporosis and menopause  Osteoporosis is a disease in which the bones lose minerals and strength with aging. This can result in serious bone fractures. Your risk for osteoporosis can be identified using a bone density scan.  If you are 68 years of age or older, or if you are at risk for osteoporosis and fractures, ask your  health care provider if you should be screened.  Ask your health care provider whether you should take a calcium or vitamin D supplement to lower your risk for osteoporosis.  Menopause may have certain physical symptoms and risks.  Hormone replacement therapy may reduce some of these symptoms and risks. Talk to your health care provider about whether hormone replacement therapy is right for you. Follow these instructions at home:  Schedule regular health, dental, and eye exams.  Stay current with your immunizations.  Do not use any tobacco products including cigarettes, chewing tobacco, or electronic cigarettes.  If you are pregnant, do not drink alcohol.  If you are breastfeeding, limit how much and how often you drink alcohol.  Limit alcohol intake to no more than 1 drink per day for nonpregnant women. One drink equals 12 ounces of beer, 5 ounces of wine, or 1 ounces of hard liquor.  Do not use street drugs.  Do not share needles.  Ask your health care provider for help if you need support or information about quitting drugs.  Tell your health care provider if you often feel depressed.  Tell your health care provider if you have ever been abused or do not feel safe at home. This information is not intended to replace advice given to you by your health care provider. Make sure you discuss any questions you have with your health care provider. Document Released: 06/01/2011 Document Revised: 04/23/2016 Document Reviewed: 08/20/2015 [employer] Interactive Patient Education  [employer].

## 2018-08-23 NOTE — Progress Notes (Signed)
Carla Sherman 42 y.o.   Chief Complaint  Patient presents with  . Annual Exam    HISTORY OF PRESENT ILLNESS: This is a 42 y.o. female Here for annual exam; no complaints and no medical concerns.  Grades her health at a C- due to nutrition concerns.  States she needs to lose 50 pounds.  Presently weighs 191 pounds.  Exercises regularly.  Goes to a gym.  Non-smoker and non-EtOH abuser.  Had a colonoscopy done last week and told that it was normal.  HPI   Prior to Admission medications   Medication Sig Start Date End Date Taking? Authorizing Provider  docusate sodium (COLACE) 50 MG capsule Take 1 capsule (50 mg total) by mouth 2 (two) times daily. Patient not taking: Reported on 08/23/2018 01/28/18   Jaynee Eagles, PA-C  fluticasone [facility]) 50 MCG/ACT nasal spray Place 2 sprays into both nostrils daily. 11/02/17   Tenna Delaine D, PA-C    No Known Allergies  Patient Active Problem List   Diagnosis Date Noted  . Prediabetes 07/26/2017    Past Medical History:  Diagnosis Date  . Abnormal Pap smear   . Infection    UTI X 1  . Pneumonia 2008  . Pregnancy induced hypertension    PP 2011    Past Surgical History:  Procedure Laterality Date  . CESAREAN SECTION [name] 09/28/2013   Procedure: Primary Cesarean Section Delivery Baby Girl @ 2154, Apgars 8/9;  Surgeon: Donnamae Jude, MD;  Location: [facility];  Service: Obstetrics;  Laterality: [name];    Social History   Socioeconomic History  . Marital status: Married    Spouse name: CHRISTOPHER  . Number of children: 2  . Years of education: 71  . Highest education level: Not on file  Occupational History  . 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 Financial resource strain: Not on file  . Food insecurity:    Worry: Not on file    Inability: Not on file  . Transportation needs:    Medical: Not on file    Non-medical: Not on file  Tobacco Use  .  Smoking status: Never Smoker  . Smokeless tobacco: Never Used  Substance and Sexual Activity  . Alcohol use: Yes    Comment: social  . Drug use: No  . Sexual activity: Yes    Partners: Male    Birth control/protection: Other-see comments    Comment: husband had vasectomy  Lifestyle  . Physical activity:    Days per week: Not on file    Minutes per session: Not on file  . Stress: Not on file  Relationships  . Social connections:    Talks on phone: Not on file    Gets together: Not on file    Attends religious service: Not on file    Active member of club or organization: Not on file    Attends meetings of clubs or organizations: Not on file    Relationship status: Not on file  . Intimate partner violence:    Fear of current or ex partner: Not on file    Emotionally abused: Not on file    Physically abused: Not on file    Forced sexual activity: Not on file  Other Topics Concern  . 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   Lives with her husband and their 2 children.  Family History  Problem Relation Age of Onset  . Diabetes Mother   . Arthritis Maternal Grandmother   . Heart disease Maternal Grandmother   . Cancer Paternal Grandmother        PANCREATIC  . Dementia Paternal Grandmother   . Hyperlipidemia Father   . Arthritis Maternal Aunt   . Cancer Maternal Aunt        BREAST  . Diabetes Maternal Aunt   . Cancer Maternal Grandfather        PROSTATE  . Cancer Paternal Grandfather        LUNG  . Cancer Maternal Aunt        BREAST  . Early death Maternal Aunt        DIED IN 10'S  . Diabetes Maternal Aunt   . Cancer Cousin        LYMPHOMA  . Thyroid disease Cousin   . Diabetes Brother      Review of Systems  Constitutional: Negative.  Negative for chills and fever.  HENT: Negative.  Negative for congestion, hearing loss, nosebleeds and sore throat.   Eyes: Negative.  Negative for blurred vision and double vision.   Respiratory: Negative.  Negative for cough and shortness of breath.   Cardiovascular: Negative.  Negative for chest pain, palpitations and leg swelling.  Gastrointestinal: Negative for abdominal pain, blood in stool, diarrhea, melena, nausea and vomiting.  Genitourinary: Negative.  Negative for dysuria and hematuria.  Musculoskeletal: Negative.  Negative for back pain, joint pain, myalgias and neck pain.  Skin: Negative.   Neurological: Negative.  Negative for dizziness, focal weakness, seizures, loss of consciousness and headaches.  Endo/Heme/Allergies: Negative.   All other systems reviewed and are negative.     Vitals:   08/23/18 1411  BP: 93/63  Pulse: 74  Resp: 16  Temp: 99.3 F (37.4 C)  SpO2: 96%    Physical Exam  Constitutional: She is oriented to person, place, and time. She appears well-developed and well-nourished.  HENT:  Head: Normocephalic and atraumatic.  Right Ear: External ear normal.  Left Ear: External ear normal.  Nose: Nose normal.  Mouth/Throat: Oropharynx is clear and moist.  Eyes: Pupils are equal, round, and reactive to light. Conjunctivae and EOM are normal.  Neck: Normal range of motion. Neck supple. No JVD present. No thyromegaly present.  Cardiovascular: Normal rate, regular rhythm, normal heart sounds and intact distal pulses.  Pulmonary/Chest: Effort normal and breath sounds normal.  Abdominal: Soft. Bowel sounds are normal. She exhibits no distension and no mass. There is no tenderness.  Musculoskeletal: Normal range of motion. She exhibits no edema or tenderness.  Lymphadenopathy:    She has no cervical adenopathy.  Neurological: She is alert and oriented to person, place, and time. No sensory deficit. She exhibits normal muscle tone. Coordination normal.  Skin: Skin is warm and dry. Capillary refill takes less than 2 seconds. No rash noted.  Psychiatric: She has a normal mood and affect. Her behavior is normal.  Vitals  reviewed.    ASSESSMENT & PLAN: [name] was seen today for annual exam.  Diagnoses and all orders for this visit:  Routine general medical examination at a health care facility -     Lipid panel; Future -     Hemoglobin A1c; Future -     CBC with Differential/Platelet; Future -     Comprehensive metabolic panel; Future -     POCT urinalysis dipstick  Screening for endocrine, nutritional, metabolic and immunity disorder -  Lipid panel; Future -     Hemoglobin A1c; Future -     CBC with Differential/Platelet; Future -     Comprehensive metabolic panel; Future -     POCT urinalysis dipstick   Patient Instructions       If you have lab work done today you will be contacted with your lab results within the next 2 weeks.  If you have not heard from [place] then please contact us. The fastest way to get your results is to register for My Chart.   IF you received an x-ray today, you will receive an invoice from [facility]. Please contact [facility] at [phone] with questions or concerns regarding your invoice.   IF you received labwork today, you will receive an invoice from [facility]. Please contact [facility] at [phone] with questions or concerns regarding your invoice.   Our billing staff will not be able to assist you with questions regarding bills from these companies.  You will be contacted with the lab results as soon as they are available. The fastest way to get your results is to activate your My Chart account. Instructions are located on the last page of this paperwork. If you have not heard from [place] regarding the results in 2 weeks, please contact this office.     Health Maintenance, Female Adopting a healthy lifestyle and getting preventive care can go a long way to promote health and wellness. Talk with your health care provider about what schedule of regular examinations is right for you. This is a good chance for you to check in with your  provider about disease prevention and staying healthy. In between checkups, there are plenty of things you can do on your own. Experts have done a lot of research about which lifestyle changes and preventive measures are most likely to keep you healthy. Ask your health care provider for more information. Weight and diet Eat a healthy diet  Be sure to include plenty of vegetables, fruits, low-fat dairy products, and lean protein.  Do not eat a lot of foods high in solid fats, added sugars, or salt.  Get regular exercise. This is one of the most important things you can do for your health. ? Most adults should exercise for at least 150 minutes each week. The exercise should increase your heart rate and make you sweat (moderate-intensity exercise). ? Most adults should also do strengthening exercises at least twice a week. This is in addition to the moderate-intensity exercise.  Maintain a healthy weight  Body mass index (BMI) is a measurement that can be used to identify possible weight problems. It estimates body fat based on height and weight. Your health care provider can help determine your BMI and help you achieve or maintain a healthy weight.  For females 57 years of age and older: ? A BMI below 18.5 is considered underweight. ? A BMI of 18.5 to 24.9 is normal. ? A BMI of 25 to 29.9 is considered overweight. ? A BMI of 30 and above is considered obese.  Watch levels of cholesterol and blood lipids  You should start having your blood tested for lipids and cholesterol at 41 years of age, then have this test every 5 years.  You may need to have your cholesterol levels checked more often if: ? Your lipid or cholesterol levels are high. ? You are older than 41 years of age. ? You are at high risk for heart disease.  Cancer screening Lung Cancer  Lung cancer screening is recommended for adults 65-74 years old who are at high risk for lung cancer because of a history of smoking.  A  yearly low-dose CT scan of the lungs is recommended for people who: ? Currently smoke. ? Have quit within the past 15 years. ? Have at least a 30-pack-year history of smoking. A pack year is smoking an average of one pack of cigarettes a day for 1 year.  Yearly screening should continue until it has been 15 years since you quit.  Yearly screening should stop if you develop a health problem that would prevent you from having lung cancer treatment.  Breast Cancer  Practice breast self-awareness. This means understanding how your breasts normally appear and feel.  It also means doing regular breast self-exams. Let your health care provider know about any changes, no matter how small.  If you are in your 20s or 30s, you should have a clinical breast exam (CBE) by a health care provider every 1-3 years as part of a regular health exam.  If you are 75 or older, have a CBE every year. Also consider having a breast X-ray (mammogram) every year.  If you have a family history of breast cancer, talk to your health care provider about genetic screening.  If you are at high risk for breast cancer, talk to your health care provider about having an MRI and a mammogram every year.  Breast cancer gene (BRCA) assessment is recommended for women who have family members with BRCA-related cancers. BRCA-related cancers include: ? Breast. ? Ovarian. ? Tubal. ? Peritoneal cancers.  Results of the assessment will determine the need for genetic counseling and BRCA1 and BRCA2 testing.  Cervical Cancer Your health care provider may recommend that you be screened regularly for cancer of the pelvic organs (ovaries, uterus, and vagina). This screening involves a pelvic examination, including checking for microscopic changes to the surface of your cervix (Pap test). You may be encouraged to have this screening done every 3 years, beginning at age 44.  For women ages 84-65, health care providers may recommend  pelvic exams and Pap testing every 3 years, or they may recommend the Pap and pelvic exam, combined with testing for human papilloma virus (HPV), every 5 years. Some types of HPV increase your risk of cervical cancer. Testing for HPV may also be done on women of any age with unclear Pap test results.  Other health care providers may not recommend any screening for nonpregnant women who are considered low risk for pelvic cancer and who do not have symptoms. Ask your health care provider if a screening pelvic exam is right for you.  If you have had past treatment for cervical cancer or a condition that could lead to cancer, you need Pap tests and screening for cancer for at least 20 years after your treatment. If Pap tests have been discontinued, your risk factors (such as having a new sexual partner) need to be reassessed to determine if screening should resume. Some women have medical problems that increase the chance of getting cervical cancer. In these cases, your health care provider may recommend more frequent screening and Pap tests.  Colorectal Cancer  This type of cancer can be detected and often prevented.  Routine colorectal cancer screening usually begins at 41 years of age and continues through 41 years of age.  Your health care provider may recommend screening at an earlier age if you have risk factors for colon cancer.  Your  health care provider may also recommend using home test kits to check for hidden blood in the stool.  A small camera at the end of a tube can be used to examine your colon directly (sigmoidoscopy or colonoscopy). This is done to check for the earliest forms of colorectal cancer.  Routine screening usually begins at age 32.  Direct examination of the colon should be repeated every 5-10 years through 41 years of age. However, you may need to be screened more often if early forms of precancerous polyps or small growths are found.  Skin Cancer  Check your skin  from head to toe regularly.  Tell your health care provider about any new moles or changes in moles, especially if there is a change in a mole's shape or color.  Also tell your health care provider if you have a mole that is larger than the size of a pencil eraser.  Always use sunscreen. Apply sunscreen liberally and repeatedly throughout the day.  Protect yourself by wearing long sleeves, pants, a wide-brimmed hat, and sunglasses whenever you are outside.  Heart disease, diabetes, and high blood pressure  High blood pressure causes heart disease and increases the risk of stroke. High blood pressure is more likely to develop in: ? People who have blood pressure in the high end of the normal range (130-139/85-89 mm Hg). ? People who are overweight or obese. ? People who are African American.  If you are 43-79 years of age, have your blood pressure checked every 3-5 years. If you are 15 years of age or older, have your blood pressure checked every year. You should have your blood pressure measured twice-once when you are at a hospital or clinic, and once when you are not at a hospital or clinic. Record the average of the two measurements. To check your blood pressure when you are not at a hospital or clinic, you can use: ? An automated blood pressure machine at a pharmacy. ? A home blood pressure monitor.  If you are between 87 years and 51 years old, ask your health care provider if you should take aspirin to prevent strokes.  Have regular diabetes screenings. This involves taking a blood sample to check your fasting blood sugar level. ? If you are at a normal weight and have a low risk for diabetes, have this test once every three years after 41 years of age. ? If you are overweight and have a high risk for diabetes, consider being tested at a younger age or more often. Preventing infection Hepatitis B  If you have a higher risk for hepatitis B, you should be screened for this virus. You  are considered at high risk for hepatitis B if: ? You were born in a country where hepatitis B is common. Ask your health care provider which countries are considered high risk. ? Your parents were born in a high-risk country, and you have not been immunized against hepatitis B (hepatitis B vaccine). ? You have HIV or AIDS. ? You use needles to inject street drugs. ? You live with someone who has hepatitis B. ? You have had sex with someone who has hepatitis B. ? You get hemodialysis treatment. ? You take certain medicines for conditions, including cancer, organ transplantation, and autoimmune conditions.  Hepatitis C  Blood testing is recommended for: ? Everyone born from [birth year] through 1964. ? Anyone with known risk factors for hepatitis C.  Sexually transmitted infections (STIs)  You should be screened for  sexually transmitted infections (STIs) including gonorrhea and chlamydia if: ? You are sexually active and are younger than 41 years of age. ? You are older than 41 years of age and your health care provider tells you that you are at risk for this type of infection. ? Your sexual activity has changed since you were last screened and you are at an increased risk for chlamydia or gonorrhea. Ask your health care provider if you are at risk.  If you do not have HIV, but are at risk, it may be recommended that you take a prescription medicine daily to prevent HIV infection. This is called pre-exposure prophylaxis (PrEP). You are considered at risk if: ? You are sexually active and do not regularly use condoms or know the HIV status of your partner(s). ? You take drugs by injection. ? You are sexually active with a partner who has HIV.  Talk with your health care provider about whether you are at high risk of being infected with HIV. If you choose to begin PrEP, you should first be tested for HIV. You should then be tested every 3 months for as long as you are taking PrEP. Pregnancy  If  you are premenopausal and you may become pregnant, ask your health care provider about preconception counseling.  If you may become pregnant, take 400 to 800 micrograms (mcg) of folic acid every day.  If you want to prevent pregnancy, talk to your health care provider about birth control (contraception). Osteoporosis and menopause  Osteoporosis is a disease in which the bones lose minerals and strength with aging. This can result in serious bone fractures. Your risk for osteoporosis can be identified using a bone density scan.  If you are 41 years of age or older, or if you are at risk for osteoporosis and fractures, ask your health care provider if you should be screened.  Ask your health care provider whether you should take a calcium or vitamin D supplement to lower your risk for osteoporosis.  Menopause may have certain physical symptoms and risks.  Hormone replacement therapy may reduce some of these symptoms and risks. Talk to your health care provider about whether hormone replacement therapy is right for you. Follow these instructions at home:  Schedule regular health, dental, and eye exams.  Stay current with your immunizations.  Do not use any tobacco products including cigarettes, chewing tobacco, or electronic cigarettes.  If you are pregnant, do not drink alcohol.  If you are breastfeeding, limit how much and how often you drink alcohol.  Limit alcohol intake to no more than 1 drink per day for nonpregnant women. One drink equals 12 ounces of beer, 5 ounces of wine, or 1 ounces of hard liquor.  Do not use street drugs.  Do not share needles.  Ask your health care provider for help if you need support or information about quitting drugs.  Tell your health care provider if you often feel depressed.  Tell your health care provider if you have ever been abused or do not feel safe at home. This information is not intended to replace advice given to you by your health  care provider. Make sure you discuss any questions you have with your health care provider. Document Released: 06/01/2011 Document Revised: 04/23/2016 Document Reviewed: 08/20/2015 Elsevier Interactive Patient Education  2018 Elsevier Inc.      Agustina Caroli, MD [facility]

## 2018-08-25 ENCOUNTER — Ambulatory Visit (INDEPENDENT_AMBULATORY_CARE_PROVIDER_SITE_OTHER): Payer: BLUE CROSS/BLUE SHIELD | Admitting: Family Medicine

## 2018-08-25 DIAGNOSIS — Z1329 Encounter for screening for other suspected endocrine disorder: Secondary | ICD-10-CM | POA: Diagnosis not present

## 2018-08-25 DIAGNOSIS — Z1321 Encounter for screening for nutritional disorder: Secondary | ICD-10-CM | POA: Diagnosis not present

## 2018-08-25 DIAGNOSIS — Z0000 Encounter for general adult medical examination without abnormal findings: Secondary | ICD-10-CM | POA: Diagnosis not present

## 2018-08-25 DIAGNOSIS — Z130 Encounter for screening for diseases of the blood and blood-forming organs and certain disorders involving the immune mechanism: Secondary | ICD-10-CM

## 2018-08-25 DIAGNOSIS — Z13228 Encounter for screening for other metabolic disorders: Secondary | ICD-10-CM | POA: Diagnosis not present

## 2018-08-25 NOTE — Progress Notes (Signed)
Lab only visit 

## 2018-08-26 ENCOUNTER — Encounter: Payer: Self-pay | Admitting: *Deleted

## 2018-08-26 LAB — COMPREHENSIVE METABOLIC PANEL
ALT: 10 IU/L (ref 0–32)
AST: 10 IU/L (ref 0–40)
Albumin/Globulin Ratio: 1.6 (ref 1.2–2.2)
Albumin: 4.3 g/dL (ref 3.5–5.5)
Alkaline Phosphatase: 54 IU/L (ref 39–117)
BUN/Creatinine Ratio: 15 (ref 9–23)
BUN: 11 mg/dL (ref 6–24)
Bilirubin Total: 0.3 mg/dL (ref 0.0–1.2)
CALCIUM: 8.9 mg/dL (ref 8.7–10.2)
CO2: 24 mmol/L (ref 20–29)
Chloride: 102 mmol/L (ref 96–106)
Creatinine, Ser: 0.71 mg/dL (ref 0.57–1.00)
GFR, EST AFRICAN AMERICAN: 122 mL/min/{1.73_m2} (ref >59)
GFR, EST NON AFRICAN AMERICAN: 106 mL/min/{1.73_m2} (ref >59)
GLUCOSE: 84 mg/dL (ref 65–99)
Globulin, Total: 2.7 g/dL (ref 1.5–4.5)
Potassium: 4.1 mmol/L (ref 3.5–5.2)
Sodium: 140 mmol/L (ref 134–144)
TOTAL PROTEIN: 7 g/dL (ref 6.0–8.5)

## 2018-08-26 LAB — LIPID PANEL
CHOLESTEROL TOTAL: 232 mg/dL — AB (ref 100–199)
Chol/HDL Ratio: 4.1 ratio (ref 0.0–4.4)
HDL: 57 mg/dL (ref >39)
LDL CALC: 166 mg/dL — AB (ref 0–99)
TRIGLYCERIDES: 44 mg/dL (ref 0–149)
VLDL Cholesterol Cal: 9 mg/dL (ref 5–40)

## 2018-08-26 LAB — CBC WITH DIFFERENTIAL/PLATELET
BASOS ABS: 0 10*3/uL (ref 0.0–0.2)
BASOS: 1 %
EOS (ABSOLUTE): 0.2 10*3/uL (ref 0.0–0.4)
Eos: 5 %
Hematocrit: 36.5 % (ref 34.0–46.6)
Hemoglobin: 11.8 g/dL (ref 11.1–15.9)
IMMATURE GRANS (ABS): 0 10*3/uL (ref 0.0–0.1)
IMMATURE GRANULOCYTES: 0 %
LYMPHS: 35 %
Lymphocytes Absolute: 1.4 10*3/uL (ref 0.7–3.1)
MCH: 29 pg (ref 26.6–33.0)
MCHC: 32.3 g/dL (ref 31.5–35.7)
MCV: 90 fL (ref 79–97)
MONOCYTES: 10 %
Monocytes Absolute: 0.4 10*3/uL (ref 0.1–0.9)
NEUTROS ABS: 2 10*3/uL (ref 1.4–7.0)
NEUTROS PCT: 49 %
PLATELETS: 319 10*3/uL (ref 150–450)
RBC: 4.07 x10E6/uL (ref 3.77–5.28)
RDW: 12.7 % (ref 12.3–15.4)
WBC: 4.1 10*3/uL (ref 3.4–10.8)

## 2018-08-26 LAB — HEMOGLOBIN A1C
Est. average glucose Bld gHb Est-mCnc: 117 mg/dL
Hgb A1c MFr Bld: 5.7 % — ABNORMAL HIGH (ref 4.8–5.6)

## 2019-05-10 DIAGNOSIS — E049 Nontoxic goiter, unspecified: Secondary | ICD-10-CM | POA: Diagnosis not present

## 2019-05-10 DIAGNOSIS — Z1231 Encounter for screening mammogram for malignant neoplasm of breast: Secondary | ICD-10-CM | POA: Diagnosis not present

## 2019-05-10 DIAGNOSIS — Z1151 Encounter for screening for human papillomavirus (HPV): Secondary | ICD-10-CM | POA: Diagnosis not present

## 2019-05-10 DIAGNOSIS — Z6834 Body mass index (BMI) 34.0-34.9, adult: Secondary | ICD-10-CM | POA: Diagnosis not present

## 2019-05-10 DIAGNOSIS — Z01419 Encounter for gynecological examination (general) (routine) without abnormal findings: Secondary | ICD-10-CM | POA: Diagnosis not present

## 2019-06-12 DIAGNOSIS — R1310 Dysphagia, unspecified: Secondary | ICD-10-CM | POA: Diagnosis not present

## 2019-06-12 DIAGNOSIS — E0789 Other specified disorders of thyroid: Secondary | ICD-10-CM | POA: Diagnosis not present

## 2019-06-12 DIAGNOSIS — K219 Gastro-esophageal reflux disease without esophagitis: Secondary | ICD-10-CM | POA: Diagnosis not present

## 2019-06-12 DIAGNOSIS — Z7289 Other problems related to lifestyle: Secondary | ICD-10-CM | POA: Diagnosis not present

## 2019-06-16 ENCOUNTER — Other Ambulatory Visit: Payer: Self-pay | Admitting: Otolaryngology

## 2019-06-16 DIAGNOSIS — E049 Nontoxic goiter, unspecified: Secondary | ICD-10-CM

## 2019-06-16 DIAGNOSIS — R4702 Dysphasia: Secondary | ICD-10-CM

## 2019-07-06 ENCOUNTER — Other Ambulatory Visit: Payer: BLUE CROSS/BLUE SHIELD

## 2019-07-17 ENCOUNTER — Other Ambulatory Visit: Payer: Self-pay

## 2019-09-19 ENCOUNTER — Ambulatory Visit (INDEPENDENT_AMBULATORY_CARE_PROVIDER_SITE_OTHER): Payer: BLUE CROSS/BLUE SHIELD | Admitting: Family Medicine

## 2019-10-04 ENCOUNTER — Ambulatory Visit (INDEPENDENT_AMBULATORY_CARE_PROVIDER_SITE_OTHER): Payer: BLUE CROSS/BLUE SHIELD | Admitting: Family Medicine

## 2020-04-19 ENCOUNTER — Ambulatory Visit: Payer: Self-pay | Attending: Internal Medicine

## 2020-04-19 ENCOUNTER — Other Ambulatory Visit: Payer: Self-pay

## 2020-04-19 DIAGNOSIS — Z23 Encounter for immunization: Secondary | ICD-10-CM

## 2020-04-19 NOTE — Progress Notes (Signed)
   Covid-19 Vaccination Clinic  Name:  Carla Sherman    MRN: [id number] DOB: 08-Mar-1976  04/19/2020  [name] was observed post Covid-19 immunization for 15 minutes without incident. She was provided with Vaccine Information Sheet and instruction to access the V-Safe system.   [name] was instructed to call 911 with any severe reactions post vaccine: Marland Kitchen Difficulty breathing  . Swelling of face and throat  . A fast heartbeat  . A bad rash all over body  . Dizziness and weakness   Immunizations Administered    Name Date Dose VIS Date Route   Pfizer COVID-19 Vaccine 04/19/2020  9:13 AM 0.3 mL 01/24/2019 Intramuscular   Manufacturer: [employer], [employer]   Lot: [id number]   NDC: [address]-1

## 2020-05-11 ENCOUNTER — Ambulatory Visit: Payer: Self-pay | Attending: Internal Medicine

## 2020-05-11 DIAGNOSIS — Z23 Encounter for immunization: Secondary | ICD-10-CM

## 2020-05-11 NOTE — Progress Notes (Signed)
   Covid-19 Vaccination Clinic  Name:  Carla Sherman    MRN: [id number] DOB: 1975-12-29  05/11/2020  [name] was observed post Covid-19 immunization for 15 minutes without incident. She was provided with Vaccine Information Sheet and instruction to access the V-Safe system.   [name] was instructed to call 911 with any severe reactions post vaccine: Marland Kitchen Difficulty breathing  . Swelling of face and throat  . A fast heartbeat  . A bad rash all over body  . Dizziness and weakness   Immunizations Administered    Name Date Dose VIS Date Route   Pfizer COVID-19 Vaccine 05/11/2020 10:33 AM 0.3 mL 01/24/2019 Intranasal   Manufacturer: [employer], [employer]   Lot: [id number]   NDC: [address]-3

## 2020-12-23 ENCOUNTER — Other Ambulatory Visit: Payer: Self-pay

## 2020-12-23 DIAGNOSIS — Z20822 Contact with and (suspected) exposure to covid-19: Secondary | ICD-10-CM

## 2020-12-24 LAB — SARS-COV-2, NAA 2 DAY TAT

## 2020-12-24 LAB — NOVEL CORONAVIRUS, NAA: SARS-CoV-2, NAA: NOT DETECTED

## 2022-07-08 ENCOUNTER — Encounter (INDEPENDENT_AMBULATORY_CARE_PROVIDER_SITE_OTHER): Payer: Self-pay

## 2024-01-27 ENCOUNTER — Other Ambulatory Visit: Payer: Self-pay

## 2024-01-27 ENCOUNTER — Encounter: Payer: Self-pay | Admitting: Emergency Medicine

## 2024-01-27 ENCOUNTER — Emergency Department
Admission: EM | Admit: 2024-01-27 | Discharge: 2024-01-27 | Disposition: A | Discharge status: Home / Self Care | Payer: 59 | Attending: Emergency Medicine | Admitting: Emergency Medicine

## 2024-01-27 DIAGNOSIS — S60221A Contusion of right hand, initial encounter: Secondary | ICD-10-CM | POA: Insufficient documentation

## 2024-01-27 DIAGNOSIS — Y92410 Unspecified street and highway as the place of occurrence of the external cause: Secondary | ICD-10-CM | POA: Diagnosis not present

## 2024-01-27 DIAGNOSIS — S8011XA Contusion of right lower leg, initial encounter: Secondary | ICD-10-CM | POA: Diagnosis not present

## 2024-01-27 DIAGNOSIS — S6991XA Unspecified injury of right wrist, hand and finger(s), initial encounter: Secondary | ICD-10-CM | POA: Diagnosis present

## 2024-01-27 MED ORDER — CYCLOBENZAPRINE HCL 5 MG PO TABS: 5.0000 mg | ORAL_TABLET | Freq: Three times a day (TID) | ORAL | 0 refills | Status: AC | PRN

## 2024-01-27 NOTE — Discharge Instructions (Signed)
 Your exam is normal and reassuring. Take OTC Tylenol and Motrin for pain and inflammation. Take the muscle relaxant as needed.
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
